# Patient Record
Sex: Female | Born: 2000 | Race: Black or African American | Hispanic: No | Marital: Single | Smoking: Never smoker
Health system: Southern US, Community
[De-identification: ages and names within clinical notes are randomized; demographics above are authoritative.]

## PROBLEM LIST (undated history)

## (undated) ENCOUNTER — Inpatient Hospital Stay (HOSPITAL_COMMUNITY): Payer: Self-pay

## (undated) ENCOUNTER — Emergency Department (HOSPITAL_COMMUNITY): Admission: EM | Payer: Commercial Managed Care - HMO | Source: Home / Self Care

## (undated) DIAGNOSIS — G43909 Migraine, unspecified, not intractable, without status migrainosus: Secondary | ICD-10-CM

## (undated) HISTORY — PX: APPENDECTOMY: SHX54

---
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
## 2022-04-15 ENCOUNTER — Emergency Department (HOSPITAL_BASED_OUTPATIENT_CLINIC_OR_DEPARTMENT_OTHER)
Admission: EM | Admit: 2022-04-15 | Discharge: 2022-04-15 | Discharge status: Against Medical Advice | Payer: Commercial Managed Care - HMO | Attending: Emergency Medicine | Admitting: Emergency Medicine

## 2022-04-15 ENCOUNTER — Encounter (HOSPITAL_BASED_OUTPATIENT_CLINIC_OR_DEPARTMENT_OTHER): Payer: Self-pay | Admitting: Emergency Medicine

## 2022-04-15 DIAGNOSIS — N939 Abnormal uterine and vaginal bleeding, unspecified: Secondary | ICD-10-CM | POA: Insufficient documentation

## 2022-04-15 DIAGNOSIS — Z5321 Procedure and treatment not carried out due to patient leaving prior to being seen by health care provider: Secondary | ICD-10-CM | POA: Diagnosis not present

## 2022-04-15 DIAGNOSIS — N898 Other specified noninflammatory disorders of vagina: Secondary | ICD-10-CM | POA: Diagnosis present

## 2022-04-15 NOTE — ED Notes (Signed)
Called pt to take to treatment room  No response from lobby 

## 2022-04-15 NOTE — ED Notes (Signed)
Called for second time, no response 

## 2022-04-15 NOTE — ED Notes (Signed)
Called for third time  No one in lobby at this time

## 2022-04-15 NOTE — ED Triage Notes (Signed)
Pt states she is having vaginal itching and odor  Pt has hx of BV  Pt states she is also having light vaginal bleeding  Pt has murina birth control

## 2022-04-18 ENCOUNTER — Encounter (HOSPITAL_BASED_OUTPATIENT_CLINIC_OR_DEPARTMENT_OTHER): Payer: Self-pay | Admitting: Emergency Medicine

## 2022-04-18 ENCOUNTER — Emergency Department (HOSPITAL_BASED_OUTPATIENT_CLINIC_OR_DEPARTMENT_OTHER)
Admission: EM | Admit: 2022-04-18 | Discharge: 2022-04-18 | Disposition: A | Discharge status: Home / Self Care | Payer: Commercial Managed Care - HMO | Attending: Emergency Medicine | Admitting: Emergency Medicine

## 2022-04-18 ENCOUNTER — Other Ambulatory Visit: Payer: Self-pay

## 2022-04-18 DIAGNOSIS — B9689 Other specified bacterial agents as the cause of diseases classified elsewhere: Secondary | ICD-10-CM

## 2022-04-18 DIAGNOSIS — N760 Acute vaginitis: Secondary | ICD-10-CM | POA: Insufficient documentation

## 2022-04-18 DIAGNOSIS — N898 Other specified noninflammatory disorders of vagina: Secondary | ICD-10-CM | POA: Diagnosis present

## 2022-04-18 LAB — WET PREP, GENITAL
Sperm: NONE SEEN
Trich, Wet Prep: NONE SEEN
WBC, Wet Prep HPF POC: <10 (ref <10)
Yeast Wet Prep HPF POC: NONE SEEN

## 2022-04-18 MED ORDER — METRONIDAZOLE 500 MG PO TABS
500.0000 mg | ORAL_TABLET | Freq: Once | ORAL | Status: AC
  Administered 2022-04-18: 500 mg via ORAL
  Filled 2022-04-18: qty 1

## 2022-04-18 MED ORDER — METRONIDAZOLE 500 MG PO TABS: 500.0000 mg | ORAL_TABLET | Freq: Two times a day (BID) | ORAL | 0 refills | Status: AC

## 2022-04-18 NOTE — ED Triage Notes (Signed)
Pt reports foul vaginal odor; hx of BV; some discharge, no pain

## 2022-04-18 NOTE — ED Provider Notes (Signed)
[facility] Provider Note   CSN: [id number] Arrival date & time: 04/18/22  2140     History  Chief Complaint  Patient presents with   Vaginal Discharge    [name] is a 21 y.o. female.  Patient here for vaginal discharge.  She thinks she has bacterial vaginosis.  She has no abdominal pain.  No fever.  Nothing makes it worse or better.  Wants to be checked for BV.  Has no concern for STDs.  The history is provided by the patient.       Home Medications Prior to Admission medications   Medication Sig Start Date End Date Taking? Authorizing Provider  metroNIDAZOLE (FLAGYL) 500 MG tablet Take 1 tablet (500 mg total) by mouth 2 (two) times daily for 7 days. 04/18/22 04/25/22 Yes Damaria Stofko, DO  clindamycin-benzoyl peroxide (BENZACLIN) gel APPLY AA BID 10/15/17   [provider]  cyclobenzaprine (FLEXERIL) 10 MG tablet Take 1 tablet (10 mg total) by mouth 2 (two) times daily as needed for muscle spasms. 11/29/21   Molpus, John, MD  cyproheptadine (PERIACTIN) 4 MG tablet Take 4 mg by mouth 2 (two) times daily.    [provider]      Allergies    Patient has no known allergies.    Review of Systems   Review of Systems  Physical Exam Updated Vital Signs BP (!) 143/64   Pulse (!) 108   Temp (!) 97.5 F (36.4 C) (Oral)   Resp 18   Ht 5\' 5"  (1.651 m)   Wt 122.9 kg   SpO2 100%   BMI 45.10 kg/m  Physical Exam Constitutional:      General: She is not in acute distress. Abdominal:     Tenderness: There is no abdominal tenderness.  Genitourinary:    Comments: Deferred per patient preference Skin:    General: Skin is warm.  Neurological:     Mental Status: She is alert.     ED Results / Procedures / Treatments   Labs (all labs ordered are listed, but only abnormal results are displayed) Labs Reviewed  WET PREP, GENITAL - Abnormal; Notable for the following components:      Result Value   Clue Cells Wet Prep  HPF POC PRESENT (*)    All other components within normal limits  GC/CHLAMYDIA PROBE AMP ([place]) NOT AT [facility]    EKG None  Radiology No results found.  Procedures Procedures    Medications Ordered in ED Medications  metroNIDAZOLE (FLAGYL) tablet 500 mg (has no administration in time range)    ED Course/ Medical Decision Making/ A&P                           Medical Decision Making Amount and/or Complexity of Data Reviewed Labs: ordered.  Risk Prescription drug management.   [name] is here for vaginal discharge.  She is concerned she has BV.  Normal vitals.  No fever.  Denies any pain.  She has had some clear discharge.  Feels like when she has had BV.  She has normal vitals.  No tenderness.  Have no concern for PID.  We will have her self swab for gonorrhea, chlamydia, BV, yeast infection.  Positive for bacterial vaginosis.  Will treat with Flagyl.  Discharged in good condition.  This chart was dictated using voice recognition software.  Despite best efforts to proofread,  errors can occur which can change the  documentation meaning.         Final Clinical Impression(s) / ED Diagnoses Final diagnoses:  BV (bacterial vaginosis)    Rx / DC Orders ED Discharge Orders          Ordered    metroNIDAZOLE (FLAGYL) 500 MG tablet  2 times daily        04/18/22 2230              Lennice Sites, DO 04/18/22 2230

## 2022-04-18 NOTE — Discharge Instructions (Addendum)
Take next dose of antibiotic for bacterial vaginosis tomorrow morning.  Take as prescribed.  Follow-up your gonorrhea and Chlamydia test in a MyChart.

## 2022-04-20 LAB — GC/CHLAMYDIA PROBE AMP (~~LOC~~) NOT AT ARMC
Chlamydia: NEGATIVE
Comment: NEGATIVE
Comment: NORMAL
Neisseria Gonorrhea: NEGATIVE

## 2022-04-25 ENCOUNTER — Encounter (HOSPITAL_BASED_OUTPATIENT_CLINIC_OR_DEPARTMENT_OTHER): Payer: Self-pay | Admitting: Emergency Medicine

## 2022-04-25 ENCOUNTER — Other Ambulatory Visit: Payer: Self-pay

## 2022-04-25 ENCOUNTER — Emergency Department (HOSPITAL_BASED_OUTPATIENT_CLINIC_OR_DEPARTMENT_OTHER)
Admission: EM | Admit: 2022-04-25 | Discharge: 2022-04-25 | Disposition: A | Discharge status: Home / Self Care | Payer: Commercial Managed Care - HMO | Attending: Emergency Medicine | Admitting: Emergency Medicine

## 2022-04-25 DIAGNOSIS — J029 Acute pharyngitis, unspecified: Secondary | ICD-10-CM | POA: Diagnosis present

## 2022-04-25 DIAGNOSIS — J020 Streptococcal pharyngitis: Secondary | ICD-10-CM | POA: Diagnosis not present

## 2022-04-25 LAB — GROUP A STREP BY PCR: Group A Strep by PCR: NOT DETECTED

## 2022-04-25 MED ORDER — PENICILLIN V POTASSIUM 500 MG PO TABS: 500.0000 mg | ORAL_TABLET | Freq: Two times a day (BID) | ORAL | 0 refills | Status: AC

## 2022-04-25 NOTE — ED Provider Notes (Signed)
[facility] Provider Note   CSN: [id number] Arrival date & time: 04/25/22  1713     History  Chief Complaint  Patient presents with   Sore Throat    [name] is a 21 y.o. female who presents emergency department with concerns for sore throat onset yesterday.  Denies sick contacts at this time.  Has associated painful swallowing.  No meds tried prior to arrival.  Denies trouble swallowing, rhinorrhea, nasal congestion, fever, cough.  The history is provided by the patient. No language interpreter was used.       Home Medications Prior to Admission medications   Medication Sig Start Date End Date Taking? Authorizing Provider  penicillin v potassium (VEETID) 500 MG tablet Take 1 tablet (500 mg total) by mouth 2 (two) times daily for 10 days. 04/25/22 05/05/22 Yes Leavy Heatherly A, PA-C  clindamycin-benzoyl peroxide (BENZACLIN) gel APPLY AA BID 10/15/17   [provider]  cyclobenzaprine (FLEXERIL) 10 MG tablet Take 1 tablet (10 mg total) by mouth 2 (two) times daily as needed for muscle spasms. 11/29/21   Molpus, John, MD  cyproheptadine (PERIACTIN) 4 MG tablet Take 4 mg by mouth 2 (two) times daily.    [provider]  metroNIDAZOLE (FLAGYL) 500 MG tablet Take 1 tablet (500 mg total) by mouth 2 (two) times daily for 7 days. 04/18/22 04/25/22  Lennice Sites, DO      Allergies    Patient has no known allergies.    Review of Systems   Review of Systems  All other systems reviewed and are negative.   Physical Exam Updated Vital Signs BP 128/88   Pulse (!) 118   Temp 98.7 F (37.1 C) (Oral)   Resp 17   Ht 5\' 5"  (1.651 m)   Wt 122.9 kg   LMP 04/04/2022   SpO2 98%   BMI 45.10 kg/m  Physical Exam Vitals and nursing note reviewed.  Constitutional:      General: She is not in acute distress.    Appearance: Normal appearance.  HENT:     Mouth/Throat:     Mouth: Mucous membranes are moist.     Pharynx: Oropharynx is  clear. Uvula midline. Posterior oropharyngeal erythema present. No uvula swelling.     Tonsils: Tonsillar exudate present.     Comments: Uvula midline without swelling.  Posterior pharyngeal erythema and tonsillar exudate noted.  Able to speak in clear complete sentences.  Eyes:     General: No scleral icterus.    Extraocular Movements: Extraocular movements intact.  Cardiovascular:     Rate and Rhythm: Normal rate.  Pulmonary:     Effort: Pulmonary effort is normal. No respiratory distress.  Abdominal:     Palpations: Abdomen is soft. There is no mass.     Tenderness: There is no abdominal tenderness.  Musculoskeletal:        General: Normal range of motion.     Cervical back: Neck supple.  Skin:    General: Skin is warm and dry.     Findings: No rash.  Neurological:     Mental Status: She is alert.     Sensory: Sensation is intact.     Motor: Motor function is intact.  Psychiatric:        Behavior: Behavior normal.     ED Results / Procedures / Treatments   Labs (all labs ordered are listed, but only abnormal results are displayed) Labs Reviewed  GROUP A STREP BY PCR  EKG None  Radiology No results found.  Procedures Procedures    Medications Ordered in ED Medications - No data to display  ED Course/ Medical Decision Making/ A&P Clinical Course as of 04/25/22 1830  Sun Apr 25, 2022  1750 Offered patient ibuprofen and Tylenol, patient declines at this time. [SB]  1750 Discussed with patient swab results.  Discussed discharge treatment plan.  Answered all of your questions.  Patient for safe discharge at this time. [SB]    Clinical Course User Index [SB] Keaunna Skipper A, PA-C                           Medical Decision Making Amount and/or Complexity of Data Reviewed Labs:  Decision-making details documented in ED Course.  Risk Prescription drug management.   Patient with sore throat onset yesterday.  Denies sick contacts.  On exam patient with Uvula  midline without swelling.  Posterior pharyngeal erythema and tonsillar exudate noted.  Able to speak in clear complete sentences.  Differential diagnosis includes strep pharyngitis, peritonsillar abscess, strep pharyngitis, or Ludwig's angina.   Labs:  I ordered, and personally interpreted labs.  The pertinent results include:   Strep swab negative   Disposition: Pt presentation suspicious for strep pharyngitis. Doubt COVID or flu at this time. Patent airway, tolerating secretions, no concern for airway compromise. Less likely Ludwigs angina, no trismus or edema to floor of mouth on exam. Less likely peritonsillar abscess, no fluctuant abscess noted on exam, patent airway, oxygen saturation 100%, water and tolerating secretions.  Patient sent with a prescription for penicillin.  After consideration of the diagnostic results and the patients response to treatment, I feel that the patient would benefit from Discharge home.  Supportive care measures and strict return cautions discussed with patient at bedside.  Patient acknowledges and verbalizes understanding.  Recommended primary care follow-up.  Patient appears safe for discharge at this time.  Follow-up as indicated in discharge paperwork.  This chart was dictated using voice recognition software, Dragon. Despite the best efforts of this provider to proofread and correct errors, errors may still occur which can change documentation meaning.   Final Clinical Impression(s) / ED Diagnoses Final diagnoses:  Strep pharyngitis    Rx / DC Orders ED Discharge Orders          Ordered    penicillin v potassium (VEETID) 500 MG tablet  2 times daily        04/25/22 1808              Queenie Aufiero A, PA-C 04/25/22 1830    Glyn Ade, MD 04/26/22 1506

## 2022-04-25 NOTE — ED Triage Notes (Signed)
Pt arrives pov, steady gait, c/o sore throat since yesterday

## 2022-04-25 NOTE — Discharge Instructions (Signed)
It was a pleasure taking care of you today!  Your strep test was negative however due to the concerns on the physical exam today, you will be sent with a prescription for penicillin to treat for strep pharyngitis. You may take over-the-counter 600 mg ibuprofen every 6 hours and alternate with 500 mg Tylenol every 6 hours as needed for pain for no more than 7 days.  Ensure to maintain fluid intake with tea, broth, soup, Gatorade, Pedialyte, water.  You may follow-up with your primary care provider as needed.  Return to the emergency department if experiencing increasing/worsening trouble swallowing, trouble breathing, fever, worsening symptoms.

## 2022-08-22 ENCOUNTER — Encounter (HOSPITAL_BASED_OUTPATIENT_CLINIC_OR_DEPARTMENT_OTHER): Payer: Self-pay | Admitting: Emergency Medicine

## 2022-08-22 ENCOUNTER — Emergency Department (HOSPITAL_BASED_OUTPATIENT_CLINIC_OR_DEPARTMENT_OTHER)
Admission: EM | Admit: 2022-08-22 | Discharge: 2022-08-22 | Disposition: A | Discharge status: Home / Self Care | Payer: Commercial Managed Care - HMO | Attending: Emergency Medicine | Admitting: Emergency Medicine

## 2022-08-22 ENCOUNTER — Emergency Department (HOSPITAL_BASED_OUTPATIENT_CLINIC_OR_DEPARTMENT_OTHER): Payer: Commercial Managed Care - HMO

## 2022-08-22 ENCOUNTER — Other Ambulatory Visit: Payer: Self-pay

## 2022-08-22 DIAGNOSIS — M7989 Other specified soft tissue disorders: Secondary | ICD-10-CM

## 2022-08-22 DIAGNOSIS — R2231 Localized swelling, mass and lump, right upper limb: Secondary | ICD-10-CM | POA: Insufficient documentation

## 2022-08-22 MED ORDER — IBUPROFEN 400 MG PO TABS: 400.0000 mg | ORAL_TABLET | Freq: Four times a day (QID) | ORAL | 0 refills | Status: DC | PRN

## 2022-08-22 MED ORDER — IBUPROFEN 400 MG PO TABS
400.0000 mg | ORAL_TABLET | Freq: Once | ORAL | Status: AC
  Administered 2022-08-22: 400 mg via ORAL
  Filled 2022-08-22: qty 1

## 2022-08-22 NOTE — ED Provider Notes (Signed)
[facility] Provider Note   CSN: [id number] Arrival date & time: 08/22/22  0204     History  Chief Complaint  Patient presents with   Hand Swelling    [name] is a 22 y.o. female.  The history is provided by the patient.  Illness Location:  Right hand Quality:  Swelling Severity:  Moderate Onset quality:  Gradual Duration: months. Timing:  Constant Progression:  Unchanged Chronicity:  Chronic Context:  No trauma.  No change in diet Relieved by:  Nothing Worsened by:  Nothing Ineffective treatments:  None Associated symptoms: no fever and no wheezing   Also feels like left hand may be swelling.       Home Medications Prior to Admission medications   Medication Sig Start Date End Date Taking? Authorizing Provider  ibuprofen (ADVIL) 400 MG tablet Take 1 tablet (400 mg total) by mouth every 6 (six) hours as needed. 08/22/22  Yes Lavoris Sparling, MD  clindamycin-benzoyl peroxide (BENZACLIN) gel APPLY AA BID 10/15/17   [provider]  cyclobenzaprine (FLEXERIL) 10 MG tablet Take 1 tablet (10 mg total) by mouth 2 (two) times daily as needed for muscle spasms. 11/29/21   Molpus, John, MD  cyproheptadine (PERIACTIN) 4 MG tablet Take 4 mg by mouth 2 (two) times daily.    [provider]      Allergies    Patient has no known allergies.    Review of Systems   Review of Systems  Constitutional:  Negative for fever.  HENT:  Negative for facial swelling.   Respiratory:  Negative for wheezing and stridor.   All other systems reviewed and are negative.   Physical Exam Updated Vital Signs BP 128/88 (BP Location: Left Arm)   Pulse 89   Temp 98.7 F (37.1 C) (Oral)   Resp 18   Ht '5\' 5"'$  (1.651 m)   Wt 125 kg   SpO2 100%   BMI 45.86 kg/m  Physical Exam Vitals and nursing note reviewed.  Constitutional:      General: She is not in acute distress.    Appearance: Normal appearance. She is well-developed.   HENT:     Head: Normocephalic and atraumatic.     Nose: Nose normal.  Eyes:     Pupils: Pupils are equal, round, and reactive to light.  Cardiovascular:     Rate and Rhythm: Normal rate and regular rhythm.     Pulses: Normal pulses.     Heart sounds: Normal heart sounds.  Pulmonary:     Effort: Pulmonary effort is normal. No respiratory distress.     Breath sounds: Normal breath sounds.  Abdominal:     General: Bowel sounds are normal. There is no distension.     Palpations: Abdomen is soft.     Tenderness: There is no abdominal tenderness. There is no guarding or rebound.  Genitourinary:    Vagina: No vaginal discharge.  Musculoskeletal:        General: Normal range of motion.     Right wrist: No swelling, deformity, effusion, bony tenderness, snuff box tenderness or crepitus. Normal range of motion. Normal pulse.     Left wrist: No swelling, deformity, bony tenderness, snuff box tenderness or crepitus. Normal range of motion. Normal pulse.     Right hand: No deformity, lacerations or bony tenderness. Normal range of motion. Normal strength. Normal sensation. There is no disruption of two-point discrimination. Normal capillary refill. Normal pulse.     Cervical  back: Neck supple.     Comments: No edema of the hands, no warmth no erythema, Skin is intact and normal cap refill < 2 sec  Skin:    General: Skin is warm and dry.     Capillary Refill: Capillary refill takes less than 2 seconds.     Coloration: Skin is not pale.     Findings: No bruising, erythema or rash.  Neurological:     General: No focal deficit present.     Mental Status: She is alert.     Deep Tendon Reflexes: Reflexes normal.  Psychiatric:        Mood and Affect: Mood normal.     ED Results / Procedures / Treatments   Labs (all labs ordered are listed, but only abnormal results are displayed) Labs Reviewed - No data to display  EKG None  Radiology DG Hand Complete Right  Result Date:  08/22/2022 CLINICAL DATA:  Hand pain for 1 month, no known injury, initial encounter EXAM: RIGHT HAND - COMPLETE 3+ VIEW COMPARISON:  None Available. FINDINGS: There is no evidence of fracture or dislocation. There is no evidence of arthropathy or other focal bone abnormality. Soft tissues are unremarkable. IMPRESSION: No acute abnormality noted. Electronically Signed   By: Inez Catalina M.D.   On: 08/22/2022 02:26    Procedures Procedures    Medications Ordered in ED Medications  ibuprofen (ADVIL) tablet 400 mg (has no administration in time range)    ED Course/ Medical Decision Making/ A&P                             Medical Decision Making Hand swelling R for 2 months now feels left is also swelling No trauma   Amount and/or Complexity of Data Reviewed External Data Reviewed: notes.    Details: Previous notes reviewed  Radiology: ordered and independent interpretation performed.    Details: Normal hand Xray   Risk Prescription drug management. Risk Details: No signs of trauma.  No edema.  Exam is benign and reassuring. Ice elevation and NSAIDs and follow up with a family doctor for ongoing care.      Final Clinical Impression(s) / ED Diagnoses Final diagnoses:  None   Return for intractable cough, coughing up blood, fevers > 100.4 unrelieved by medication, shortness of breath, intractable vomiting, chest pain, shortness of breath, weakness, numbness, changes in speech, facial asymmetry, abdominal pain, passing out, Inability to tolerate liquids or food, cough, altered mental status or any concerns. No signs of systemic illness or infection. The patient is nontoxic-appearing on exam and vital signs are within normal limits.  I have reviewed the triage vital signs and the nursing notes. Pertinent labs & imaging results that were available during my care of the patient were reviewed by me and considered in my medical decision making (see chart for details). After history, exam, and  medical workup I feel the patient has been appropriately medically screened and is safe for discharge home. Pertinent diagnoses were discussed with the patient. Patient was given return precautions.  Rx / DC Orders ED Discharge Orders          Ordered    ibuprofen (ADVIL) 400 MG tablet  Every 6 hours PRN        08/22/22 0417              Dailin Sosnowski, MD 08/22/22 [id number]

## 2022-08-22 NOTE — ED Triage Notes (Signed)
Patient reports left finger and hand swelling x 3 weeks.  Patient reports her right hand is starting to do the same.  Patient has obvious swelling to left hand.  No redness or warmth noted, patient reports no fevers.

## 2022-09-26 ENCOUNTER — Encounter (HOSPITAL_BASED_OUTPATIENT_CLINIC_OR_DEPARTMENT_OTHER): Payer: Self-pay

## 2022-09-26 ENCOUNTER — Emergency Department (HOSPITAL_BASED_OUTPATIENT_CLINIC_OR_DEPARTMENT_OTHER)
Admission: EM | Admit: 2022-09-26 | Discharge: 2022-09-26 | Disposition: A | Discharge status: Home / Self Care | Payer: Commercial Managed Care - HMO | Attending: Emergency Medicine | Admitting: Emergency Medicine

## 2022-09-26 DIAGNOSIS — R110 Nausea: Secondary | ICD-10-CM | POA: Diagnosis present

## 2022-09-26 LAB — URINALYSIS, ROUTINE W REFLEX MICROSCOPIC
Bilirubin Urine: NEGATIVE
Glucose, UA: NEGATIVE mg/dL
Hgb urine dipstick: NEGATIVE
Ketones, ur: NEGATIVE mg/dL
Leukocytes,Ua: NEGATIVE
Nitrite: NEGATIVE
Protein, ur: NEGATIVE mg/dL
Specific Gravity, Urine: 1.014 (ref 1.005–1.030)
pH: 8 (ref 5.0–8.0)

## 2022-09-26 LAB — PREGNANCY, URINE: Preg Test, Ur: NEGATIVE

## 2022-09-26 NOTE — ED Triage Notes (Signed)
Pt c/o morning sickness x3 mornings, "smells of food," unsure if pregnant, states her LMP was 2wks ago, "8hrs of bleeding one day last week." Endorses intermittent abd cramping, poor PO/ "no appetite, haven't had anything to eat all day." States she smokes marijuana every day.

## 2022-09-26 NOTE — ED Provider Notes (Signed)
[facility] AT [facility] Provider Note   CSN: [id number] Arrival date & time: 09/26/22  1621     History  Chief Complaint  Patient presents with   Nausea    [name] is a 22 y.o. female, no pertinent past medical history, who presents to the ED secondary to nausea for the last 3 mornings.  She states she does have reduced appetite, and feels sick in the mornings.  Denies any vomiting.  Is concerned that she may be pregnant.  Last menstrual period was 2 weeks ago and she states she only bled heavily for 1 day, and her periods typically last for 7 days.  She is sexually active and has an IUD.  Is mostly concerned she is pregnant.  Denies any abdominal pain or new vaginal discharge.    Does smoke marijuana daily. Home Medications Prior to Admission medications   Medication Sig Start Date End Date Taking? Authorizing Provider  clindamycin-benzoyl peroxide (BENZACLIN) gel APPLY AA BID 10/15/17   [provider]  cyclobenzaprine (FLEXERIL) 10 MG tablet Take 1 tablet (10 mg total) by mouth 2 (two) times daily as needed for muscle spasms. 11/29/21   Molpus, John, MD  cyproheptadine (PERIACTIN) 4 MG tablet Take 4 mg by mouth 2 (two) times daily.    [provider]  ibuprofen (ADVIL) 400 MG tablet Take 1 tablet (400 mg total) by mouth every 6 (six) hours as needed. 08/22/22   Palumbo, April, MD      Allergies    Patient has no known allergies.    Review of Systems   Review of Systems  Gastrointestinal:  Positive for nausea. Negative for abdominal pain.  Genitourinary:  Positive for vaginal bleeding.    Physical Exam Updated Vital Signs BP (!) 147/95   Pulse (!) 117   Temp 99.5 F (37.5 C) (Oral)   Resp 16   SpO2 99%  Physical Exam Vitals and nursing note reviewed.  Constitutional:      General: She is not in acute distress.    Appearance: She is well-developed.  HENT:     Head: Normocephalic and atraumatic.  Eyes:      Conjunctiva/sclera: Conjunctivae normal.  Cardiovascular:     Rate and Rhythm: Normal rate and regular rhythm.     Heart sounds: No murmur heard. Pulmonary:     Effort: Pulmonary effort is normal. No respiratory distress.     Breath sounds: Normal breath sounds.  Abdominal:     Palpations: Abdomen is soft.     Tenderness: There is no abdominal tenderness.  Musculoskeletal:        General: No swelling.     Cervical back: Neck supple.  Skin:    General: Skin is warm and dry.     Capillary Refill: Capillary refill takes less than 2 seconds.  Neurological:     Mental Status: She is alert.  Psychiatric:        Mood and Affect: Mood normal.     ED Results / Procedures / Treatments   Labs (all labs ordered are listed, but only abnormal results are displayed) Labs Reviewed  URINALYSIS, ROUTINE W REFLEX MICROSCOPIC  PREGNANCY, URINE    EKG None  Radiology No results found.  Procedures Procedures    Medications Ordered in ED Medications - No data to display  ED Course/ Medical Decision Making/ A&P  Medical Decision Making Patient is a 22 year old female, here for nausea for the last 3 days, in the morning.  Worse with food.  Is concerned she may be pregnant requesting pregnancy test.  Overall abdomen soft, we will obtain urine pregnancy.  Overall well-appearing declines further workup.  Amount and/or Complexity of Data Reviewed Labs: ordered.    Details: UPT negative Discussion of management or test interpretation with external provider(s): Discussed with patient, urine pregnancy test negative.  No evidence of UTI.  She will need to follow-up with her primary care doctor, I am suspicious that her nausea may be secondary to her marijuana use.  We discussed following up with primary care and cessation of marijuana.  Return precautions emphasized.    Final Clinical Impression(s) / ED Diagnoses Final diagnoses:  Nausea    Rx / DC Orders ED  Discharge Orders     None         Eleaner Dibartolo, Harley [place], PA 09/26/22 1706    Cathren Laine, MD 09/26/22 1819

## 2022-09-26 NOTE — Discharge Instructions (Addendum)
Today you are not pregnant, your nausea may be secondary to your marijuana usage.  Stop smoking marijuana as this can cause nausea and vomiting.  Make sure you are eating bland foods to help with your nausea.  Your urinalysis was negative for any kind of infection.  If you have severe abdominal pain, intractable nausea or vomiting please return to the ER.

## 2022-11-02 ENCOUNTER — Ambulatory Visit: Payer: Commercial Managed Care - HMO

## 2022-11-03 ENCOUNTER — Ambulatory Visit
Admission: EM | Admit: 2022-11-03 | Discharge: 2022-11-03 | Disposition: A | Discharge status: Home / Self Care | Payer: Medicaid Other | Attending: Urgent Care | Admitting: Urgent Care

## 2022-11-03 DIAGNOSIS — B9689 Other specified bacterial agents as the cause of diseases classified elsewhere: Secondary | ICD-10-CM | POA: Diagnosis present

## 2022-11-03 DIAGNOSIS — N760 Acute vaginitis: Secondary | ICD-10-CM | POA: Insufficient documentation

## 2022-11-03 LAB — POCT URINE PREGNANCY: Preg Test, Ur: NEGATIVE

## 2022-11-03 MED ORDER — METRONIDAZOLE 500 MG PO TABS: 500.0000 mg | ORAL_TABLET | Freq: Two times a day (BID) | ORAL | 0 refills | Status: DC

## 2022-11-03 NOTE — ED Triage Notes (Signed)
Pt reports she had vaginal discharge and she took some medication she had for BV. Yesterday was the last day she had vaginal discharge

## 2022-11-03 NOTE — ED Provider Notes (Signed)
[facility]  Note:  This document was prepared using Conservation officer, historic buildings and may include unintentional dictation errors.  MRN: [id number] DOB: [birth year]  Subjective:   [name] is a 22 y.o. female presenting for persistent vaginal discharge.  Patient was diagnosed with BV and treated with metronidazole.  She states that she was inconsistent with the medication but restarted it in the past 2 days, her vaginal discharge ended up resolving yesterday.  However, she wanted to make sure she got a pregnancy test and retested.  No current facility-administered medications for this encounter.  Current Outpatient Medications:    clindamycin-benzoyl peroxide (BENZACLIN) gel, APPLY AA BID, Disp: , Rfl: 11   cyclobenzaprine (FLEXERIL) 10 MG tablet, Take 1 tablet (10 mg total) by mouth 2 (two) times daily as needed for muscle spasms., Disp: 20 tablet, Rfl: 0   cyproheptadine (PERIACTIN) 4 MG tablet, Take 4 mg by mouth 2 (two) times daily., Disp: , Rfl:    ibuprofen (ADVIL) 400 MG tablet, Take 1 tablet (400 mg total) by mouth every 6 (six) hours as needed., Disp: 12 tablet, Rfl: 0   No Known Allergies  History reviewed. No pertinent past medical history.   Past Surgical History:  Procedure Laterality Date   APPENDECTOMY     LAPAROSCOPIC APPENDECTOMY N/A 02/04/2014   Procedure: APPENDECTOMY LAPAROSCOPIC;  Surgeon: Judie Petit. Leonia Corona, MD;  Location: [facility] OR;  Service: Pediatrics;  Laterality: N/A;    Family History  Problem Relation Age of Onset   Heart disease Father    Heart disease Maternal Grandmother    Hypertension Maternal Grandmother     Social History   Tobacco Use   Smoking status: Never    Passive exposure: Yes   Smokeless tobacco: Never  Vaping Use   Vaping Use: Never used  Substance Use Topics   Alcohol use: No   Drug use: Yes    Types: Marijuana    ROS   Objective:   Vitals: BP (!) 143/89 (BP Location: Right Arm)   Pulse  67   Temp 99.2 F (37.3 C) (Oral)   Resp 18   LMP  (Within Months) Comment: 1 month  SpO2 98%   Physical Exam Constitutional:      General: She is not in acute distress.    Appearance: Normal appearance. She is well-developed. She is not ill-appearing, toxic-appearing or diaphoretic.  HENT:     Head: Normocephalic and atraumatic.     Nose: Nose normal.     Mouth/Throat:     Mouth: Mucous membranes are moist.  Eyes:     General: No scleral icterus.       Right eye: No discharge.        Left eye: No discharge.     Extraocular Movements: Extraocular movements intact.  Cardiovascular:     Rate and Rhythm: Normal rate.  Pulmonary:     Effort: Pulmonary effort is normal.  Skin:    General: Skin is warm and dry.  Neurological:     General: No focal deficit present.     Mental Status: She is alert and oriented to person, place, and time.  Psychiatric:        Mood and Affect: Mood normal.        Behavior: Behavior normal.    Results for orders placed or performed during the hospital encounter of 11/03/22 (from the past 24 hour(s))  POCT urine pregnancy     Status: None   Collection Time:  11/03/22  5:32 PM  Result Value Ref Range   Preg Test, Ur Negative Negative   Assessment and Plan :   PDMP not reviewed this encounter.  1. Bacterial vaginosis    Patient would like to finish out a 7-day course of metronidazole for recurrent BV.  I was agreeable to do this.  Does not want any other empiric treatment.  STI testing pending.  Counseled patient on potential for adverse effects with medications prescribed/recommended today, ER and return-to-clinic precautions discussed, patient verbalized understanding.    Wallis Bamberg, PA-C 11/03/22 1750

## 2022-11-04 LAB — CERVICOVAGINAL ANCILLARY ONLY
Bacterial Vaginitis (gardnerella): NEGATIVE
Candida Glabrata: NEGATIVE
Candida Vaginitis: NEGATIVE
Chlamydia: NEGATIVE
Comment: NEGATIVE
Comment: NEGATIVE
Comment: NEGATIVE
Comment: NEGATIVE
Comment: NEGATIVE
Comment: NORMAL
Neisseria Gonorrhea: NEGATIVE
Trichomonas: NEGATIVE

## 2022-11-22 ENCOUNTER — Ambulatory Visit
Admission: EM | Admit: 2022-11-22 | Discharge: 2022-11-22 | Disposition: A | Discharge status: Home / Self Care | Payer: Medicaid Other | Attending: Internal Medicine | Admitting: Internal Medicine

## 2022-11-22 DIAGNOSIS — R350 Frequency of micturition: Secondary | ICD-10-CM

## 2022-11-22 DIAGNOSIS — R300 Dysuria: Secondary | ICD-10-CM | POA: Diagnosis not present

## 2022-11-22 LAB — POCT URINALYSIS DIP (MANUAL ENTRY)
Bilirubin, UA: NEGATIVE
Glucose, UA: NEGATIVE mg/dL
Ketones, POC UA: NEGATIVE mg/dL
Nitrite, UA: NEGATIVE
Protein Ur, POC: NEGATIVE mg/dL
Spec Grav, UA: 1.02 (ref 1.010–1.025)
Urobilinogen, UA: 0.2 E.U./dL
pH, UA: 8.5 — AB (ref 5.0–8.0)

## 2022-11-22 LAB — POCT URINE PREGNANCY: Preg Test, Ur: NEGATIVE

## 2022-11-22 NOTE — Discharge Instructions (Signed)
Make sure you hydrate very well with plain water and a quantity of 80 ounces of water a day.  Please limit drinks that are considered urinary irritants such as soda, sweet tea, coffee, energy drinks, alcohol.  These can worsen your urinary and genital symptoms but also be the source of them.  I will let you know about your urine culture and vaginal cytology results through MyChart to see if we need to prescribe or change your antibiotics based off of those results.

## 2022-11-22 NOTE — ED Provider Notes (Signed)
[facility]  Note:  This document was prepared using Conservation officer, historic buildings and may include unintentional dictation errors.  MRN: [id number] DOB: Jun 21, 2001  Subjective:   Grainne Schlarb is a 22 y.o. female presenting for 1 week history of urinary frequency, dysuria. Feels slight vaginal swelling. Denies fever, n/v, abdominal pain, pelvic pain, rashes, hematuria, vaginal discharge.  No worries about an STI but would like to be tested. Has 1 sex partner, no protection, has 1 female partner. Has 1 bottle of water daily. Has Gatorade, Minute Maid juice.   No current facility-administered medications for this encounter.  Current Outpatient Medications:    clindamycin-benzoyl peroxide (BENZACLIN) gel, APPLY AA BID, Disp: , Rfl: 11   cyclobenzaprine (FLEXERIL) 10 MG tablet, Take 1 tablet (10 mg total) by mouth 2 (two) times daily as needed for muscle spasms., Disp: 20 tablet, Rfl: 0   cyproheptadine (PERIACTIN) 4 MG tablet, Take 4 mg by mouth 2 (two) times daily., Disp: , Rfl:    ibuprofen (ADVIL) 400 MG tablet, Take 1 tablet (400 mg total) by mouth every 6 (six) hours as needed., Disp: 12 tablet, Rfl: 0   metroNIDAZOLE (FLAGYL) 500 MG tablet, Take 1 tablet (500 mg total) by mouth 2 (two) times daily., Disp: 14 tablet, Rfl: 0   No Known Allergies  History reviewed. No pertinent past medical history.   Past Surgical History:  Procedure Laterality Date   APPENDECTOMY     LAPAROSCOPIC APPENDECTOMY N/A 02/04/2014   Procedure: APPENDECTOMY LAPAROSCOPIC;  Surgeon: Judie Petit. Leonia Corona, MD;  Location: [facility] OR;  Service: Pediatrics;  Laterality: N/A;    Family History  Problem Relation Age of Onset   Heart disease Father    Heart disease Maternal Grandmother    Hypertension Maternal Grandmother     Social History   Tobacco Use   Smoking status: Never    Passive exposure: Yes   Smokeless tobacco: Never  Vaping Use   Vaping Use: Never used  Substance Use  Topics   Alcohol use: No   Drug use: Yes    Types: Marijuana    ROS   Objective:   Vitals: BP (!) 157/104 (BP Location: Right Arm)   Pulse 77   Temp 98.3 F (36.8 C) (Oral)   Resp 20   LMP 11/06/2022   SpO2 98%   BP Readings from Last 3 Encounters:  11/22/22 (!) 157/104  11/03/22 (!) 143/89  09/26/22 (!) 147/95   Physical Exam Constitutional:      General: She is not in acute distress.    Appearance: Normal appearance. She is well-developed. She is not ill-appearing, toxic-appearing or diaphoretic.  HENT:     Head: Normocephalic and atraumatic.     Nose: Nose normal.     Mouth/Throat:     Mouth: Mucous membranes are moist.  Eyes:     General: No scleral icterus.       Right eye: No discharge.        Left eye: No discharge.     Extraocular Movements: Extraocular movements intact.  Cardiovascular:     Rate and Rhythm: Normal rate.  Pulmonary:     Effort: Pulmonary effort is normal.  Skin:    General: Skin is warm and dry.  Neurological:     General: No focal deficit present.     Mental Status: She is alert and oriented to person, place, and time.  Psychiatric:        Mood and Affect: Mood normal.  Behavior: Behavior normal.     Results for orders placed or performed during the hospital encounter of 11/22/22 (from the past 24 hour(s))  POCT urinalysis dipstick     Status: Abnormal   Collection Time: 11/22/22  5:41 PM  Result Value Ref Range   Color, UA yellow yellow   Clarity, UA clear clear   Glucose, UA negative negative mg/dL   Bilirubin, UA negative negative   Ketones, POC UA negative negative mg/dL   Spec Grav, UA 1.610 9.604 - 1.025   Blood, UA trace-intact (A) negative   pH, UA 8.5 (A) 5.0 - 8.0   Protein Ur, POC negative negative mg/dL   Urobilinogen, UA 0.2 0.2 or 1.0 E.U./dL   Nitrite, UA Negative Negative   Leukocytes, UA Trace (A) Negative  POCT urine pregnancy     Status: None   Collection Time: 11/22/22  5:42 PM  Result Value  Ref Range   Preg Test, Ur Negative Negative    Assessment and Plan :   PDMP not reviewed this encounter.  1. Urinary frequency   2. Dysuria    Patient states that she initially had vaginal itching and dysuria but this has resolved and now she only has urinary frequency.  Emphasized need to hydrate more consistently with water and avoid urinary irritants.  Will treat based off of any results that come back positive. Counseled patient on potential for adverse effects with medications prescribed/recommended today, ER and return-to-clinic precautions discussed, patient verbalized understanding.    Wallis Bamberg, [place] 11/22/22 1827

## 2022-11-22 NOTE — ED Triage Notes (Signed)
Pt c/o urinary freq and dysuria x 1 week-NAD-steady gait

## 2022-11-23 ENCOUNTER — Telehealth: Payer: Self-pay | Admitting: Emergency Medicine

## 2022-11-23 MED ORDER — SULFAMETHOXAZOLE-TRIMETHOPRIM 800-160 MG PO TABS: 1.0000 | ORAL_TABLET | Freq: Two times a day (BID) | ORAL | 0 refills | Status: AC

## 2022-11-23 MED ORDER — FLUCONAZOLE 150 MG PO TABS: 150.0000 mg | ORAL_TABLET | Freq: Once | ORAL | 0 refills | Status: AC

## 2022-11-23 NOTE — Telephone Encounter (Signed)
Patient called requesting an antibiotic for her urinalysis POC results.  Reviewed and explained provider did not see signs of infection.  Patient is concerned for UTI still and was hoping for an antibiotic.  Reviewed with Urban Gibson, will treat with Bactrim and diflucan and have patient f/u if no improvement.
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
## 2023-01-07 ENCOUNTER — Emergency Department (HOSPITAL_COMMUNITY)
Admission: EM | Admit: 2023-01-07 | Discharge: 2023-01-08 | Disposition: A | Discharge status: Home / Self Care | Payer: No Typology Code available for payment source | Attending: Emergency Medicine | Admitting: Emergency Medicine

## 2023-01-07 DIAGNOSIS — Y93G3 Activity, cooking and baking: Secondary | ICD-10-CM | POA: Diagnosis not present

## 2023-01-07 DIAGNOSIS — T22292A Burn of second degree of multiple sites of left shoulder and upper limb, except wrist and hand, initial encounter: Secondary | ICD-10-CM

## 2023-01-07 DIAGNOSIS — T25221A Burn of second degree of right foot, initial encounter: Secondary | ICD-10-CM | POA: Diagnosis not present

## 2023-01-07 DIAGNOSIS — D72829 Elevated white blood cell count, unspecified: Secondary | ICD-10-CM | POA: Diagnosis not present

## 2023-01-07 DIAGNOSIS — T22211A Burn of second degree of right forearm, initial encounter: Secondary | ICD-10-CM | POA: Diagnosis not present

## 2023-01-07 DIAGNOSIS — X102XXA Contact with fats and cooking oils, initial encounter: Secondary | ICD-10-CM | POA: Insufficient documentation

## 2023-01-07 DIAGNOSIS — T310 Burns involving less than 10% of body surface: Secondary | ICD-10-CM | POA: Diagnosis not present

## 2023-01-07 DIAGNOSIS — T22232A Burn of second degree of left upper arm, initial encounter: Secondary | ICD-10-CM | POA: Diagnosis present

## 2023-01-07 DIAGNOSIS — T2121XA Burn of second degree of chest wall, initial encounter: Secondary | ICD-10-CM | POA: Insufficient documentation

## 2023-01-07 DIAGNOSIS — T24632A Corrosion of second degree of left lower leg, initial encounter: Secondary | ICD-10-CM

## 2023-01-07 DIAGNOSIS — T24232A Burn of second degree of left lower leg, initial encounter: Secondary | ICD-10-CM | POA: Diagnosis not present

## 2023-01-08 ENCOUNTER — Other Ambulatory Visit: Payer: Self-pay

## 2023-01-08 ENCOUNTER — Encounter (HOSPITAL_COMMUNITY): Payer: Self-pay

## 2023-01-08 DIAGNOSIS — T22232A Burn of second degree of left upper arm, initial encounter: Secondary | ICD-10-CM | POA: Diagnosis not present

## 2023-01-08 LAB — CBC WITH DIFFERENTIAL/PLATELET
Abs Immature Granulocytes: 0.02 10*3/uL (ref 0.00–0.07)
Basophils Absolute: 0 10*3/uL (ref 0.0–0.1)
Basophils Relative: 0 %
Eosinophils Absolute: 0 10*3/uL (ref 0.0–0.5)
Eosinophils Relative: 0 %
HCT: 39.8 % (ref 36.0–46.0)
Hemoglobin: 13.2 g/dL (ref 12.0–15.0)
Immature Granulocytes: 0 %
Lymphocytes Relative: 23 %
Lymphs Abs: 2.6 10*3/uL (ref 0.7–4.0)
MCH: 27.2 pg (ref 26.0–34.0)
MCHC: 33.2 g/dL (ref 30.0–36.0)
MCV: 82.1 fL (ref 80.0–100.0)
Monocytes Absolute: 0.8 10*3/uL (ref 0.1–1.0)
Monocytes Relative: 7 %
Neutro Abs: 8 10*3/uL — ABNORMAL HIGH (ref 1.7–7.7)
Neutrophils Relative %: 70 %
Platelets: 221 10*3/uL (ref 150–400)
RBC: 4.85 MIL/uL (ref 3.87–5.11)
RDW: 13 % (ref 11.5–15.5)
WBC: 11.4 10*3/uL — ABNORMAL HIGH (ref 4.0–10.5)
nRBC: 0 % (ref 0.0–0.2)

## 2023-01-08 LAB — BASIC METABOLIC PANEL
Anion gap: 14 (ref 5–15)
BUN: 7 mg/dL (ref 6–20)
CO2: 19 mmol/L — ABNORMAL LOW (ref 22–32)
Calcium: 8.8 mg/dL — ABNORMAL LOW (ref 8.9–10.3)
Chloride: 105 mmol/L (ref 98–111)
Creatinine, Ser: 0.97 mg/dL (ref 0.44–1.00)
GFR, Estimated: >60 mL/min (ref >60)
Glucose, Bld: 212 mg/dL — ABNORMAL HIGH (ref 70–99)
Potassium: 3.5 mmol/L (ref 3.5–5.1)
Sodium: 138 mmol/L (ref 135–145)

## 2023-01-08 LAB — HCG, SERUM, QUALITATIVE: Preg, Serum: NEGATIVE

## 2023-01-08 MED ORDER — HYDROMORPHONE HCL 1 MG/ML IJ SOLN
1.0000 mg | Freq: Once | INTRAMUSCULAR | Status: AC
  Administered 2023-01-08: 1 mg via INTRAVENOUS
  Filled 2023-01-08: qty 1

## 2023-01-08 MED ORDER — BACITRACIN ZINC 500 UNIT/GM EX OINT
TOPICAL_OINTMENT | Freq: Two times a day (BID) | CUTANEOUS | Status: DC
  Filled 2023-01-08 (×2): qty 28.4

## 2023-01-08 MED ORDER — OXYCODONE-ACETAMINOPHEN 5-325 MG PO TABS
1.0000 | ORAL_TABLET | ORAL | Status: AC | PRN
  Administered 2023-01-08: 1 via ORAL
  Filled 2023-01-08: qty 1

## 2023-01-08 MED ORDER — LIDOCAINE HCL URETHRAL/MUCOSAL 2 % EX GEL
1.0000 | Freq: Once | CUTANEOUS | Status: AC
  Administered 2023-01-08: 1 via TOPICAL
  Filled 2023-01-08 (×6): qty 6

## 2023-01-08 MED ORDER — OXYCODONE-ACETAMINOPHEN 5-325 MG PO TABS: 1.0000 | ORAL_TABLET | Freq: Four times a day (QID) | ORAL | 0 refills | Status: DC | PRN

## 2023-01-08 MED ORDER — KETOROLAC TROMETHAMINE 15 MG/ML IJ SOLN
15.0000 mg | Freq: Once | INTRAMUSCULAR | Status: AC
  Administered 2023-01-08: 15 mg via INTRAVENOUS
  Filled 2023-01-08: qty 1

## 2023-01-08 MED ORDER — BACITRACIN ZINC 500 UNIT/GM EX OINT: 1.0000 | TOPICAL_OINTMENT | Freq: Two times a day (BID) | CUTANEOUS | 0 refills | Status: DC

## 2023-01-08 MED ORDER — ONDANSETRON HCL 4 MG/2ML IJ SOLN
INTRAMUSCULAR | Status: AC
  Administered 2023-01-08: 4 mg via INTRAVENOUS
  Filled 2023-01-08: qty 2

## 2023-01-08 MED ORDER — ONDANSETRON HCL 4 MG/2ML IJ SOLN: 4.0000 mg | Freq: Once | INTRAMUSCULAR | Status: AC

## 2023-01-08 MED ORDER — CEPHALEXIN 500 MG PO CAPS: 500.0000 mg | ORAL_CAPSULE | Freq: Four times a day (QID) | ORAL | 0 refills | Status: AC

## 2023-01-08 MED ORDER — LACTATED RINGERS IV BOLUS
1000.0000 mL | Freq: Once | INTRAVENOUS | Status: AC
  Administered 2023-01-08: 1000 mL via INTRAVENOUS
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
## 2023-01-08 NOTE — ED Notes (Signed)
Patient remains in considerable pain despite multiple does of pain medications. Family supportive at bedside

## 2023-03-26 ENCOUNTER — Ambulatory Visit
Admission: RE | Admit: 2023-03-26 | Discharge: 2023-03-26 | Disposition: A | Discharge status: Home / Self Care | Payer: No Typology Code available for payment source | Source: Ambulatory Visit | Attending: Internal Medicine

## 2023-03-26 VITALS — BP 135/85 | HR 66 | Temp 97.6°F | Resp 16

## 2023-03-26 DIAGNOSIS — G43819 Other migraine, intractable, without status migrainosus: Secondary | ICD-10-CM

## 2023-03-26 MED ORDER — SUMATRIPTAN SUCCINATE 100 MG PO TABS
100.0000 mg | ORAL_TABLET | ORAL | 0 refills | Status: DC | PRN
Start: 2023-03-26 — End: 2023-05-16

## 2023-03-26 MED ORDER — KETOROLAC TROMETHAMINE 30 MG/ML IJ SOLN
30.0000 mg | Freq: Once | INTRAMUSCULAR | Status: AC
  Administered 2023-03-26: 30 mg via INTRAMUSCULAR

## 2023-03-26 NOTE — Discharge Instructions (Signed)
You were given a Toradol injection in clinic today. Do not take any over the counter NSAID's such as Advil, ibuprofen, Aleve, or naproxen for 24 hours. You may take tylenol if needed.  I have sent you a prescription for sumatriptan which is a rescue migraine medication for you to try.  Take 1 tablet at onset of your migraine and you may repeat in 2 hours if your symptoms persist.  Do not take more than 2 doses in a 24-hour period.  Please follow-up with your PCP for further migraine treatment options.  Please go to the ER if you develop any worsening symptoms.  I hope you feel better soon!
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
## 2023-03-26 NOTE — ED Provider Notes (Signed)
[facility]    CSN: [id number] Arrival date & time: 03/26/23  1240      History   Chief Complaint Chief Complaint  Patient presents with   Migraine    Entered by patient    HPI [name] is a 22 y.o. female presents for evaluation of a migraine.  Patient reports she woke today with a left-sided headache/migraine that she describes as a throbbing/pressure.  Headache is associated with photosensitivity, nausea, dizziness.  She currently rates the headache as a 9 out of 10 and denies that this is the worst headache of her life.  Denies any syncope, visual changes, unilateral weakness.  Does have a history of migraines.  She does not take any rescue medications for this.  She took 1000 mg of Tylenol 3 hours prior to arrival.  No NSAIDs this morning.  She also states 3 days ago she had cold-like symptoms that have completely resolved.  She does not currently have a PCP concerning this time.   Migraine Associated symptoms include headaches.    History reviewed. No pertinent past medical history.  Patient Active Problem List   Diagnosis Date Noted   Migraine with aura and without status migrainosus 12/13/2017   Migraine without aura and without status migrainosus, not intractable 08/30/2017   Episodic tension-type headache, not intractable 08/30/2017   Morbid obesity (HCC) 08/30/2017   Acanthosis nigricans, acquired 08/30/2017   Appendicitis, acute, with generalized peritonitis 02/05/2014    Past Surgical History:  Procedure Laterality Date   APPENDECTOMY     LAPAROSCOPIC APPENDECTOMY N/A 02/04/2014   Procedure: APPENDECTOMY LAPAROSCOPIC;  Surgeon: Judie Petit. Leonia Corona, MD;  Location: [facility] OR;  Service: Pediatrics;  Laterality: N/A;    OB History   No obstetric history on file.      Home Medications    Prior to Admission medications   Medication Sig Start Date End Date Taking? Authorizing Provider  SUMAtriptan (IMITREX) 100 MG tablet Take 1 tablet (100 mg total)  by mouth as needed for migraine. Take 1 tablet at onset of migraine symptoms and may repeat in 2 hours if headache persists or recurs.  Do not take more than 2 doses in a 24 hours. 03/26/23  Yes Radford Pax, NP  bacitracin ointment Apply 1 Application topically 2 (two) times daily. 01/08/23   Jeannie Fend, PA-C  clindamycin-benzoyl peroxide (BENZACLIN) gel APPLY AA BID 10/15/17   [provider]  cyclobenzaprine (FLEXERIL) 10 MG tablet Take 1 tablet (10 mg total) by mouth 2 (two) times daily as needed for muscle spasms. 11/29/21   Molpus, John, MD  cyproheptadine (PERIACTIN) 4 MG tablet Take 4 mg by mouth 2 (two) times daily.    [provider]  ibuprofen (ADVIL) 400 MG tablet Take 1 tablet (400 mg total) by mouth every 6 (six) hours as needed. 08/22/22   Palumbo, April, MD  metroNIDAZOLE (FLAGYL) 500 MG tablet Take 1 tablet (500 mg total) by mouth 2 (two) times daily. 11/03/22   Wallis Bamberg, PA-C  oxyCODONE-acetaminophen (PERCOCET/ROXICET) 5-325 MG tablet Take 1 tablet by mouth every 6 (six) hours as needed for severe pain. 01/08/23   Jeannie Fend, PA-C    Family History Family History  Problem Relation Age of Onset   Heart disease Father    Heart disease Maternal Grandmother    Hypertension Maternal Grandmother     Social History Social History   Tobacco Use   Smoking status: Never    Passive exposure: Yes  Smokeless tobacco: Never  Vaping Use   Vaping status: Never Used  Substance Use Topics   Alcohol use: No   Drug use: Not Currently    Types: Marijuana     Allergies   Patient has no known allergies.   Review of Systems Review of Systems  Eyes:  Positive for photophobia.  Gastrointestinal:  Positive for nausea.  Neurological:  Positive for dizziness and headaches.     Physical Exam Triage Vital Signs ED Triage Vitals  Encounter Vitals Group     BP 03/26/23 1255 135/85     Systolic BP Percentile --      Diastolic BP Percentile --      Pulse  Rate 03/26/23 1255 66     Resp 03/26/23 1255 16     Temp 03/26/23 1255 97.6 F (36.4 C)     Temp Source 03/26/23 1255 Oral     SpO2 03/26/23 1255 99 %     Weight --      Height --      Head Circumference --      Peak Flow --      Pain Score 03/26/23 1254 8     Pain Loc --      Pain Education --      Exclude from Growth Chart --    No data found.  Updated Vital Signs BP 135/85 (BP Location: Right Arm)   Pulse 66   Temp 97.6 F (36.4 C) (Oral)   Resp 16   LMP 03/25/2023 (Exact Date)   SpO2 99%   Visual Acuity Right Eye Distance:   Left Eye Distance:   Bilateral Distance:    Right Eye Near:   Left Eye Near:    Bilateral Near:     Physical Exam Vitals and nursing note reviewed.  Constitutional:      General: She is not in acute distress.    Appearance: Normal appearance. She is not toxic-appearing or diaphoretic.  HENT:     Head: Normocephalic and atraumatic.  Eyes:     Extraocular Movements: Extraocular movements intact.     Conjunctiva/sclera: Conjunctivae normal.     Pupils: Pupils are equal, round, and reactive to light.  Cardiovascular:     Rate and Rhythm: Normal rate.  Pulmonary:     Effort: Pulmonary effort is normal.  Skin:    General: Skin is warm and dry.  Neurological:     General: No focal deficit present.     Mental Status: She is alert and oriented to person, place, and time.     GCS: GCS eye subscore is 4. GCS verbal subscore is 5. GCS motor subscore is 6.     Cranial Nerves: No facial asymmetry.     Motor: No weakness or pronator drift.     Coordination: Romberg sign negative. Coordination normal.  Psychiatric:        Mood and Affect: Mood normal.        Behavior: Behavior normal.      UC Treatments / Results  Labs (all labs ordered are listed, but only abnormal results are displayed) Labs Reviewed - No data to display  EKG   Radiology No results found.  Procedures Procedures (including critical care time)  Medications  Ordered in UC Medications  ketorolac (TORADOL) 30 MG/ML injection 30 mg (30 mg Intramuscular Given 03/26/23 1316)    Initial Impression / Assessment and Plan / UC Course  I have reviewed the triage vital signs and the nursing notes.  Pertinent labs & imaging results that were available during my care of the patient were reviewed by me and considered in my medical decision making (see chart for details).     Reviewed symptoms no red flags.  Patient with history of migraines presenting with acute headache that began today.  Neuroexam unremarkable.  Patient denies worsening of life.  She was given Toradol injection in clinic.  She was monitored for 10 minutes after injection with no reaction noted and tolerated well. Reports improvement in HA.   She was instructed no NSAIDs for 24 hours and verbalized understanding.  She does not currently have a PCP, nursing staff was able to set her up with plan to discuss additional migraine management/rescue medications. Rx sumatriptan for pt to try for migraines.  Advised to keep a headache diary and take to her PCP.  Strict ER precautions were reviewed and patient verbalized understanding. Final Clinical Impressions(s) / UC Diagnoses   Final diagnoses:  Other migraine without status migrainosus, intractable     Discharge Instructions      You were given a Toradol injection in clinic today. Do not take any over the counter NSAID's such as Advil, ibuprofen, Aleve, or naproxen for 24 hours. You may take tylenol if needed.  I have sent you a prescription for sumatriptan which is a rescue migraine medication for you to try.  Take 1 tablet at onset of your migraine and you may repeat in 2 hours if your symptoms persist.  Do not take more than 2 doses in a 24-hour period.  Please follow-up with your PCP for further migraine treatment options.  Please go to the ER if you develop any worsening symptoms.  I hope you feel better soon!      ED Prescriptions      Medication Sig Dispense Auth. Provider   SUMAtriptan (IMITREX) 100 MG tablet Take 1 tablet (100 mg total) by mouth as needed for migraine. Take 1 tablet at onset of migraine symptoms and may repeat in 2 hours if headache persists or recurs.  Do not take more than 2 doses in a 24 hours. 2 tablet Radford Pax, NP      PDMP not reviewed this encounter.   Radford Pax, NP 03/26/23 1336

## 2023-04-04 ENCOUNTER — Ambulatory Visit: Payer: No Typology Code available for payment source | Admitting: Family Medicine

## 2023-05-16 ENCOUNTER — Encounter: Payer: Self-pay | Admitting: Family Medicine

## 2023-05-16 ENCOUNTER — Ambulatory Visit (INDEPENDENT_AMBULATORY_CARE_PROVIDER_SITE_OTHER): Payer: No Typology Code available for payment source | Admitting: Family Medicine

## 2023-05-16 VITALS — BP 112/80 | HR 59 | Temp 98.8°F | Ht 63.0 in | Wt 254.0 lb

## 2023-05-16 DIAGNOSIS — F411 Generalized anxiety disorder: Secondary | ICD-10-CM

## 2023-05-16 DIAGNOSIS — G43009 Migraine without aura, not intractable, without status migrainosus: Secondary | ICD-10-CM | POA: Diagnosis not present

## 2023-05-16 DIAGNOSIS — Z1159 Encounter for screening for other viral diseases: Secondary | ICD-10-CM | POA: Diagnosis not present

## 2023-05-16 DIAGNOSIS — Z114 Encounter for screening for human immunodeficiency virus [HIV]: Secondary | ICD-10-CM

## 2023-05-16 DIAGNOSIS — R739 Hyperglycemia, unspecified: Secondary | ICD-10-CM

## 2023-05-16 DIAGNOSIS — L2082 Flexural eczema: Secondary | ICD-10-CM

## 2023-05-16 LAB — POCT GLYCOSYLATED HEMOGLOBIN (HGB A1C): Hemoglobin A1C: 5.3 % (ref 4.0–5.6)

## 2023-05-16 LAB — TSH: TSH: 1.04 u[IU]/mL (ref 0.35–5.50)

## 2023-05-16 MED ORDER — TRIAMCINOLONE ACETONIDE 0.1 % EX CREA
1.0000 | TOPICAL_CREAM | Freq: Two times a day (BID) | CUTANEOUS | 2 refills | Status: DC
Start: 2023-05-16 — End: 2023-05-30

## 2023-05-16 MED ORDER — VENLAFAXINE HCL ER 75 MG PO CP24: 75.0000 mg | ORAL_CAPSULE | Freq: Every day | ORAL | 2 refills | Status: DC

## 2023-05-16 MED ORDER — AMITRIPTYLINE HCL 10 MG PO TABS
10.0000 mg | ORAL_TABLET | Freq: Every day | ORAL | 2 refills | Status: DC
Start: 2023-05-16 — End: 2023-06-07
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
## 2023-05-16 NOTE — Assessment & Plan Note (Signed)
FAILED sumatriptan therapy, I have given her samples of nurtec 75 mg PRN acute migraine to try. She will start amitriptyline at night for daily prevention therapy. RTC in 6 weeks for video visit to follow up.

## 2023-05-16 NOTE — Assessment & Plan Note (Signed)
Pt requesting refills on her triamcinolone cream, states she gets patches behind her neck, ears, knees, also in the crook of her elbows. Usually well controlled with steroid cream PRN, will refill for her

## 2023-05-16 NOTE — Assessment & Plan Note (Signed)
Severe on PHQ and GAD scoring. We discussed medication and we will start effexor 75 mg daily, this will also help with her chronic headache disorder and be less likely to cause weight gain. Will see her back short term in 6 weeks to re-evaluate her symptoms.

## 2023-05-16 NOTE — Patient Instructions (Signed)
Melatonin 5 mg -- can take up to 20 mg at night if needed for sleep-- available over the counter

## 2023-05-17 LAB — HIV ANTIBODY (ROUTINE TESTING W REFLEX): HIV 1&2 Ab, 4th Generation: NONREACTIVE

## 2023-05-17 LAB — HEPATITIS C ANTIBODY: Hepatitis C Ab: NONREACTIVE

## 2023-05-29 ENCOUNTER — Encounter: Payer: Self-pay | Admitting: Family Medicine

## 2023-05-29 DIAGNOSIS — L2082 Flexural eczema: Secondary | ICD-10-CM

## 2023-06-03 MED ORDER — BETAMETHASONE VALERATE 0.1 % EX OINT
1.0000 | TOPICAL_OINTMENT | Freq: Two times a day (BID) | CUTANEOUS | 0 refills | Status: DC
Start: 2023-06-03 — End: 2023-08-11

## 2023-06-07 ENCOUNTER — Other Ambulatory Visit: Payer: Self-pay | Admitting: Family Medicine

## 2023-06-07 ENCOUNTER — Ambulatory Visit: Payer: Medicaid Other | Admitting: Family Medicine

## 2023-06-07 DIAGNOSIS — G43009 Migraine without aura, not intractable, without status migrainosus: Secondary | ICD-10-CM

## 2023-06-07 DIAGNOSIS — F411 Generalized anxiety disorder: Secondary | ICD-10-CM

## 2023-06-28 ENCOUNTER — Telehealth (INDEPENDENT_AMBULATORY_CARE_PROVIDER_SITE_OTHER): Payer: Self-pay | Admitting: Family Medicine

## 2023-06-28 DIAGNOSIS — F411 Generalized anxiety disorder: Secondary | ICD-10-CM

## 2023-06-28 NOTE — Progress Notes (Signed)
 Called pt, left VM, pt did not joint the call

## 2023-07-26 ENCOUNTER — Emergency Department (HOSPITAL_BASED_OUTPATIENT_CLINIC_OR_DEPARTMENT_OTHER)
Admission: EM | Admit: 2023-07-26 | Discharge: 2023-07-26 | Disposition: A | Discharge status: Home / Self Care | Payer: No Typology Code available for payment source | Attending: Emergency Medicine | Admitting: Emergency Medicine

## 2023-07-26 ENCOUNTER — Encounter (HOSPITAL_BASED_OUTPATIENT_CLINIC_OR_DEPARTMENT_OTHER): Payer: Self-pay | Admitting: Emergency Medicine

## 2023-07-26 ENCOUNTER — Other Ambulatory Visit (HOSPITAL_BASED_OUTPATIENT_CLINIC_OR_DEPARTMENT_OTHER): Payer: Self-pay

## 2023-07-26 ENCOUNTER — Other Ambulatory Visit: Payer: Self-pay

## 2023-07-26 ENCOUNTER — Emergency Department (HOSPITAL_BASED_OUTPATIENT_CLINIC_OR_DEPARTMENT_OTHER): Payer: No Typology Code available for payment source

## 2023-07-26 DIAGNOSIS — S161XXA Strain of muscle, fascia and tendon at neck level, initial encounter: Secondary | ICD-10-CM | POA: Insufficient documentation

## 2023-07-26 DIAGNOSIS — Y92410 Unspecified street and highway as the place of occurrence of the external cause: Secondary | ICD-10-CM | POA: Diagnosis not present

## 2023-07-26 DIAGNOSIS — S199XXA Unspecified injury of neck, initial encounter: Secondary | ICD-10-CM | POA: Diagnosis present

## 2023-07-26 DIAGNOSIS — R519 Headache, unspecified: Secondary | ICD-10-CM | POA: Diagnosis not present

## 2023-07-26 HISTORY — DX: Migraine, unspecified, not intractable, without status migrainosus: G43.909

## 2023-07-26 MED ORDER — CYCLOBENZAPRINE HCL 10 MG PO TABS
10.0000 mg | ORAL_TABLET | Freq: Two times a day (BID) | ORAL | 0 refills | Status: DC | PRN
Start: 2023-07-26 — End: 2023-10-24
  Filled 2023-07-26: qty 10, 5d supply, fill #0
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
## 2023-08-08 ENCOUNTER — Other Ambulatory Visit (HOSPITAL_BASED_OUTPATIENT_CLINIC_OR_DEPARTMENT_OTHER): Payer: Self-pay

## 2023-08-11 ENCOUNTER — Other Ambulatory Visit: Payer: Self-pay | Admitting: Family Medicine

## 2023-08-11 DIAGNOSIS — L2082 Flexural eczema: Secondary | ICD-10-CM

## 2023-09-19 ENCOUNTER — Other Ambulatory Visit: Payer: Self-pay | Admitting: Family Medicine

## 2023-09-19 DIAGNOSIS — L2082 Flexural eczema: Secondary | ICD-10-CM

## 2023-10-20 ENCOUNTER — Other Ambulatory Visit: Payer: Self-pay | Admitting: Family Medicine

## 2023-10-20 DIAGNOSIS — L2082 Flexural eczema: Secondary | ICD-10-CM

## 2023-10-21 ENCOUNTER — Ambulatory Visit: Admitting: Family Medicine

## 2023-10-24 ENCOUNTER — Ambulatory Visit (INDEPENDENT_AMBULATORY_CARE_PROVIDER_SITE_OTHER): Admitting: Family Medicine

## 2023-10-24 ENCOUNTER — Encounter: Payer: Self-pay | Admitting: Family Medicine

## 2023-10-24 VITALS — BP 128/78 | HR 90 | Temp 98.4°F | Ht 64.0 in | Wt 238.5 lb

## 2023-10-24 DIAGNOSIS — Z13220 Encounter for screening for lipoid disorders: Secondary | ICD-10-CM | POA: Diagnosis not present

## 2023-10-24 DIAGNOSIS — R739 Hyperglycemia, unspecified: Secondary | ICD-10-CM | POA: Diagnosis not present

## 2023-10-24 DIAGNOSIS — Z0000 Encounter for general adult medical examination without abnormal findings: Secondary | ICD-10-CM

## 2023-10-24 DIAGNOSIS — E559 Vitamin D deficiency, unspecified: Secondary | ICD-10-CM | POA: Diagnosis not present

## 2023-10-24 DIAGNOSIS — G43009 Migraine without aura, not intractable, without status migrainosus: Secondary | ICD-10-CM

## 2023-10-24 MED ORDER — AMITRIPTYLINE HCL 10 MG PO TABS
10.0000 mg | ORAL_TABLET | Freq: Every day | ORAL | 1 refills | Status: DC
Start: 2023-10-24 — End: 2024-05-15

## 2023-10-24 NOTE — Progress Notes (Signed)
 Complete physical exam  Patient: [name]   DOB: 2001-03-09   23 y.o. Female  MRN: [id number]  Subjective:    Chief Complaint  Patient presents with   Annual Exam    [name] is a 23 y.o. female who presents today for a complete physical exam. She reports consuming a general diet.  Works 2 jobs so has little time for exercise, works shifts at [employer] and this job is very active.   She generally feels well. She reports sleeping fairly well. She does not have additional problems to discuss today.    Most recent fall risk assessment:     No data to display           Most recent depression screenings:    10/24/2023    1:25 PM 05/16/2023   11:38 AM  PHQ 2/9 Scores  PHQ - 2 Score 0 3  PHQ- 9 Score 4 12    Vision:Within last year and Dental: No current dental problems and Receives regular dental care  Patient Active Problem List   Diagnosis Date Noted   Generalized anxiety disorder 05/16/2023   Flexural eczema 05/16/2023   Migraine with aura and without status migrainosus 12/13/2017   Migraine without aura and without status migrainosus, not intractable 08/30/2017   Episodic tension-type headache, not intractable 08/30/2017   Morbid obesity (HCC) 08/30/2017   Acanthosis nigricans, acquired 08/30/2017   Appendicitis, acute, with generalized peritonitis 02/05/2014      Patient Care Team: Aida House, MD as PCP - General (Family Medicine)   Outpatient Medications Prior to Visit  Medication Sig   betamethasone  valerate ointment (VALISONE ) 0.1 % APPLY TO AFFECTED AREA TWICE A DAY   ibuprofen  (ADVIL ) 400 MG tablet Take 1 tablet (400 mg total) by mouth every 6 (six) hours as needed.   [DISCONTINUED] amitriptyline  (ELAVIL ) 10 MG tablet TAKE 1 TABLET BY MOUTH EVERYDAY AT BEDTIME   [DISCONTINUED] cyclobenzaprine  (FLEXERIL ) 10 MG tablet Take 1 tablet (10 mg total) by mouth 2 (two) times daily as needed for muscle spasms.   [DISCONTINUED] venlafaxine  XR  (EFFEXOR -XR) 75 MG 24 hr capsule TAKE 1 CAPSULE BY MOUTH DAILY WITH BREAKFAST.   No facility-administered medications prior to visit.    Review of Systems  HENT:  Negative for hearing loss.   Eyes:  Negative for blurred vision.  Respiratory:  Negative for shortness of breath.   Cardiovascular:  Negative for chest pain.  Gastrointestinal: Negative.   Genitourinary: Negative.   Musculoskeletal:  Negative for back pain.  Neurological:  Negative for headaches.  Psychiatric/Behavioral:  Negative for depression.        Objective:     BP 128/78   Pulse 90   Temp 98.4 F (36.9 C) (Oral)   Ht 5\' 4"  (1.626 m)   Wt 238 lb 8 oz (108.2 kg)   LMP 10/19/2023 (Exact Date)   SpO2 99%   BMI 40.94 kg/m    Physical Exam Vitals reviewed.  Constitutional:      Appearance: Normal appearance. She is well-groomed. She is morbidly obese.  HENT:     Right Ear: Tympanic membrane and ear canal normal.     Left Ear: Tympanic membrane and ear canal normal.     Mouth/Throat:     Mouth: Mucous membranes are moist.     Pharynx: No posterior oropharyngeal erythema.  Eyes:     Conjunctiva/sclera: Conjunctivae normal.  Neck:     Thyroid : No thyromegaly.  Cardiovascular:  Rate and Rhythm: Normal rate and regular rhythm.     Pulses: Normal pulses.     Heart sounds: S1 normal and S2 normal.  Pulmonary:     Effort: Pulmonary effort is normal.     Breath sounds: Normal breath sounds and air entry.  Abdominal:     General: Abdomen is flat. Bowel sounds are normal.     Palpations: Abdomen is soft.  Musculoskeletal:     Right lower leg: No edema.     Left lower leg: No edema.  Lymphadenopathy:     Cervical: No cervical adenopathy.  Neurological:     Mental Status: She is alert and oriented to person, place, and time. Mental status is at baseline.     Gait: Gait is intact.  Psychiatric:        Mood and Affect: Mood and affect normal.        Speech: Speech normal.        Behavior: Behavior  normal.        Judgment: Judgment normal.      No results found for any visits on 10/24/23.     Assessment & Plan:    Routine Health Maintenance and Physical Exam  Immunization History  Administered Date(s) Administered   Tdap 11/29/2021    Health Maintenance  Topic Date Due   HPV VACCINES (1 - 3-dose series) Never done   Meningococcal B Vaccine (1 of 2 - Standard) Never done   COVID-19 Vaccine (1 - 2024-25 season) Never done   Cervical Cancer Screening (Pap smear)  Never done   CHLAMYDIA SCREENING  11/03/2023   INFLUENZA VACCINE  01/20/2024   DTaP/Tdap/Td (2 - Td or Tdap) 11/30/2031   Hepatitis C Screening  Completed   HIV Screening  Completed    Discussed health benefits of physical activity, and encouraged her to engage in regular exercise appropriate for her age and condition.  Routine general medical examination at a health care facility Normal physical exam findings. I counseled the patient on the pap smear, pt has had an IUD in the past, is only using barrier methods. I counseled the patient on non hormonal IUD. Will have her back for pap smear and IUD pplacement. Handouts given on healthy eating and exercise, labs ordered for annual surveillance.   -     CBC with Differential/Platelet; Future -     Comprehensive metabolic panel with GFR; Future  Hyperglycemia -     Hemoglobin A1c; Future  Vitamin D deficiency -     VITAMIN D 25 Hydroxy (Vit-D Deficiency, Fractures); Future  Lipid screening -     Lipid panel; Future  Migraine without aura and without status migrainosus, not intractable -     Amitriptyline  HCl; Take 1 tablet (10 mg total) by mouth at bedtime.  Dispense: 90 tablet; Refill: 1    Return for Paragard placement with pap .     Aida House, MD

## 2023-10-24 NOTE — Patient Instructions (Signed)

## 2023-11-01 ENCOUNTER — Ambulatory Visit: Admitting: Family Medicine

## 2023-11-03 ENCOUNTER — Inpatient Hospital Stay: Admitting: Family Medicine

## 2023-11-25 ENCOUNTER — Other Ambulatory Visit (HOSPITAL_COMMUNITY)
Admission: RE | Admit: 2023-11-25 | Discharge: 2023-11-25 | Disposition: A | Discharge status: Home / Self Care | Source: Ambulatory Visit | Attending: Family Medicine | Admitting: Family Medicine

## 2023-11-25 ENCOUNTER — Encounter: Payer: Self-pay | Admitting: Family Medicine

## 2023-11-25 ENCOUNTER — Ambulatory Visit (INDEPENDENT_AMBULATORY_CARE_PROVIDER_SITE_OTHER): Admitting: Family Medicine

## 2023-11-25 VITALS — BP 118/70 | HR 71 | Temp 98.3°F | Ht 64.0 in | Wt 234.6 lb

## 2023-11-25 DIAGNOSIS — R739 Hyperglycemia, unspecified: Secondary | ICD-10-CM | POA: Diagnosis not present

## 2023-11-25 DIAGNOSIS — Z13220 Encounter for screening for lipoid disorders: Secondary | ICD-10-CM

## 2023-11-25 DIAGNOSIS — Z209 Contact with and (suspected) exposure to unspecified communicable disease: Secondary | ICD-10-CM | POA: Diagnosis not present

## 2023-11-25 DIAGNOSIS — Z124 Encounter for screening for malignant neoplasm of cervix: Secondary | ICD-10-CM | POA: Insufficient documentation

## 2023-11-25 DIAGNOSIS — Z0000 Encounter for general adult medical examination without abnormal findings: Secondary | ICD-10-CM

## 2023-11-25 DIAGNOSIS — E559 Vitamin D deficiency, unspecified: Secondary | ICD-10-CM | POA: Diagnosis not present

## 2023-11-25 LAB — LIPID PANEL
Cholesterol: 134 mg/dL (ref 0–200)
HDL: 48.2 mg/dL (ref >39.00)
LDL Cholesterol: 77 mg/dL (ref 0–99)
NonHDL: 85.5
Total CHOL/HDL Ratio: 3
Triglycerides: 44 mg/dL (ref 0.0–149.0)
VLDL: 8.8 mg/dL (ref 0.0–40.0)

## 2023-11-25 LAB — COMPREHENSIVE METABOLIC PANEL WITH GFR
ALT: 10 U/L (ref 0–35)
AST: 14 U/L (ref 0–37)
Albumin: 4.2 g/dL (ref 3.5–5.2)
Alkaline Phosphatase: 70 U/L (ref 39–117)
BUN: 10 mg/dL (ref 6–23)
CO2: 28 meq/L (ref 19–32)
Calcium: 9.3 mg/dL (ref 8.4–10.5)
Chloride: 105 meq/L (ref 96–112)
Creatinine, Ser: 0.98 mg/dL (ref 0.40–1.20)
GFR: 82.58 mL/min (ref >60.00)
Glucose, Bld: 82 mg/dL (ref 70–99)
Potassium: 3.8 meq/L (ref 3.5–5.1)
Sodium: 139 meq/L (ref 135–145)
Total Bilirubin: 0.6 mg/dL (ref 0.2–1.2)
Total Protein: 7.2 g/dL (ref 6.0–8.3)

## 2023-11-25 LAB — CBC WITH DIFFERENTIAL/PLATELET
Basophils Absolute: 0 10*3/uL (ref 0.0–0.1)
Basophils Relative: 0.6 % (ref 0.0–3.0)
Eosinophils Absolute: 0 10*3/uL (ref 0.0–0.7)
Eosinophils Relative: 0.8 % (ref 0.0–5.0)
HCT: 39.9 % (ref 36.0–46.0)
Hemoglobin: 13.1 g/dL (ref 12.0–15.0)
Lymphocytes Relative: 21.5 % (ref 12.0–46.0)
Lymphs Abs: 1 10*3/uL (ref 0.7–4.0)
MCHC: 32.8 g/dL (ref 30.0–36.0)
MCV: 79.8 fl (ref 78.0–100.0)
Monocytes Absolute: 0.3 10*3/uL (ref 0.1–1.0)
Monocytes Relative: 7 % (ref 3.0–12.0)
Neutro Abs: 3.4 10*3/uL (ref 1.4–7.7)
Neutrophils Relative %: 70.1 % (ref 43.0–77.0)
Platelets: 171 10*3/uL (ref 150.0–400.0)
RBC: 5 Mil/uL (ref 3.87–5.11)
RDW: 13.7 % (ref 11.5–15.5)
WBC: 4.8 10*3/uL (ref 4.0–10.5)

## 2023-11-25 LAB — HEMOGLOBIN A1C: Hgb A1c MFr Bld: 5.1 % (ref 4.6–6.5)

## 2023-11-25 NOTE — Patient Instructions (Signed)
 Ibuprofen 800mg  every 8 hours- take with food

## 2023-11-25 NOTE — Progress Notes (Signed)
 Established Patient Office Visit  Subjective   Patient ID: [name], female    DOB: 2000-10-30  Age: 23 y.o. MRN: [id number]  Chief Complaint  Patient presents with   Medical Management of Chronic Issues   Patient requests STD testing-denies exposure    Pt is here today for her pap smear. She is also requesting STD testing as well today. Last period was 5/31, no usually vaginal discharge, no pelvic pain. States that her periods are very heavy, uses overnight pads, about 4 times per day. Periods are painful.   Current Outpatient Medications  Medication Instructions   amitriptyline  (ELAVIL ) 10 mg, Oral, Daily at bedtime   betamethasone  valerate ointment (VALISONE ) 0.1 % APPLY TO AFFECTED AREA TWICE A DAY   ibuprofen  (ADVIL ) 400 mg, Oral, Every 6 hours PRN    Patient Active Problem List   Diagnosis Date Noted   Generalized anxiety disorder 05/16/2023   Flexural eczema 05/16/2023   Migraine with aura and without status migrainosus 12/13/2017   Migraine without aura and without status migrainosus, not intractable 08/30/2017   Episodic tension-type headache, not intractable 08/30/2017   Morbid obesity (HCC) 08/30/2017   Acanthosis nigricans, acquired 08/30/2017   Appendicitis, acute, with generalized peritonitis 02/05/2014      Review of Systems  All other systems reviewed and are negative.     Objective:     BP 118/70   Pulse 71   Temp 98.3 F (36.8 C) (Oral)   Ht 5\' 4"  (1.626 m)   Wt 234 lb 9.6 oz (106.4 kg)   LMP 11/18/2023 (Exact Date)   SpO2 98%   BMI 40.27 kg/m    Physical Exam Vitals reviewed. Exam conducted with a chaperone present.  Constitutional:      Appearance: She is morbidly obese.  Pulmonary:     Effort: Pulmonary effort is normal.  Abdominal:     General: Bowel sounds are normal.     Palpations: Abdomen is soft.  Genitourinary:    General: Normal vulva.     Exam position: Lithotomy position.     Tanner stage (genital): 5.      Vagina: Normal.     Cervix: Normal.     Uterus: Normal.      Adnexa: Right adnexa normal and left adnexa normal.     Rectum: Normal.  Neurological:     General: No focal deficit present.     Mental Status: She is alert and oriented to person, place, and time.  Psychiatric:        Mood and Affect: Mood and affect normal.     No results found for any visits on 11/25/23.    The ASCVD Risk score (Arnett DK, et al., 2019) failed to calculate for the following reasons:   The 2019 ASCVD risk score is only valid for ages 49 to 5    Assessment & Plan:  Encounter for Papanicolaou smear for cervical cancer screening -     Cytology - PAP  Exposure to communicable disease -     Cervicovaginal ancillary only -     HSV(herpes simplex vrs) 1+2 ab-IgG; Future -     RPR; Future  Vitamin D deficiency -     VITAMIN D 25 Hydroxy (Vit-D Deficiency, Fractures); Future  Lipid screening -     Lipid panel; Future   Normal physical exam findings. Patient was counseled on birth control options, she wants to wait as she has had issues in the past with hormonal contraceptives. Using barrier  method currently. Pap sent and STI testing sent to lab.   Return in about 1 year (around 11/24/2024) for annual physical exam.    Aida House, MD

## 2023-11-28 ENCOUNTER — Ambulatory Visit: Payer: Self-pay | Admitting: Family Medicine

## 2023-11-28 DIAGNOSIS — E559 Vitamin D deficiency, unspecified: Secondary | ICD-10-CM

## 2023-11-28 LAB — CERVICOVAGINAL ANCILLARY ONLY
Bacterial Vaginitis (gardnerella): NEGATIVE
Candida Glabrata: NEGATIVE
Candida Vaginitis: NEGATIVE
Chlamydia: NEGATIVE
Comment: NEGATIVE
Comment: NEGATIVE
Comment: NEGATIVE
Comment: NEGATIVE
Comment: NEGATIVE
Comment: NORMAL
Neisseria Gonorrhea: NEGATIVE
Trichomonas: NEGATIVE

## 2023-11-28 LAB — CYTOLOGY - PAP
Comment: NEGATIVE
Diagnosis: NEGATIVE
High risk HPV: NEGATIVE

## 2023-11-29 LAB — HSV(HERPES SIMPLEX VRS) I + II AB-IGG
HSV 1 IGG,TYPE SPECIFIC AB: <0.9 {index}
HSV 2 IGG,TYPE SPECIFIC AB: <0.9 {index}

## 2023-11-29 LAB — RPR: RPR Ser Ql: NONREACTIVE

## 2023-12-01 LAB — VITAMIN D 25 HYDROXY (VIT D DEFICIENCY, FRACTURES): VITD: 10.87 ng/mL — ABNORMAL LOW (ref 30.00–100.00)

## 2023-12-02 MED ORDER — VITAMIN D (ERGOCALCIFEROL) 1.25 MG (50000 UNIT) PO CAPS
50000.0000 [IU] | ORAL_CAPSULE | ORAL | 1 refills | Status: DC
Start: 2023-12-02 — End: 2024-05-15

## 2023-12-13 ENCOUNTER — Ambulatory Visit
Admission: RE | Admit: 2023-12-13 | Discharge: 2023-12-13 | Disposition: A | Discharge status: Home / Self Care | Source: Ambulatory Visit | Attending: Family Medicine | Admitting: Family Medicine

## 2023-12-13 VITALS — BP 99/74 | HR 88 | Temp 98.8°F | Resp 16

## 2023-12-13 DIAGNOSIS — Z113 Encounter for screening for infections with a predominantly sexual mode of transmission: Secondary | ICD-10-CM | POA: Insufficient documentation

## 2023-12-13 DIAGNOSIS — N760 Acute vaginitis: Secondary | ICD-10-CM | POA: Insufficient documentation

## 2023-12-13 MED ORDER — FLUCONAZOLE 150 MG PO TABS
150.0000 mg | ORAL_TABLET | Freq: Every day | ORAL | 0 refills | Status: DC
Start: 2023-12-13 — End: 2024-05-15

## 2023-12-13 NOTE — Discharge Instructions (Addendum)
 The clinic will contact you with results of the testing done today if positive.  Start Diflucan as prescribed.  Please follow-up with your PCP or gynecologist if symptoms do not improve.  Please go to the ER for any worsening symptoms.  Hope you feel better soon!

## 2023-12-13 NOTE — ED Provider Notes (Signed)
 [facility]    CSN: [id number] Arrival date & time: 12/13/23  1716      History   Chief Complaint Chief Complaint  Patient presents with   Vaginal Discharge    Entered by patient    HPI [name] is a 23 y.o. female presents for vaginal irritation.  Patient reports 2 days of vaginal itching/irritation with mild discharge.  States she used a different body wash and thinks that had contributed.  Denies any rashes, dysuria, fevers, nausea/vomiting, flank pain.  No known STD exposure but would like screening.  No OTC treatments have been used since onset.  No other complaints at this time.   Vaginal Discharge   Past Medical History:  Diagnosis Date   Migraines     Patient Active Problem List   Diagnosis Date Noted   Generalized anxiety disorder 05/16/2023   Flexural eczema 05/16/2023   Migraine with aura and without status migrainosus 12/13/2017   Migraine without aura and without status migrainosus, not intractable 08/30/2017   Episodic tension-type headache, not intractable 08/30/2017   Morbid obesity (HCC) 08/30/2017   Acanthosis nigricans, acquired 08/30/2017   Appendicitis, acute, with generalized peritonitis 02/05/2014    Past Surgical History:  Procedure Laterality Date   APPENDECTOMY     LAPAROSCOPIC APPENDECTOMY N/A 02/04/2014   Procedure: APPENDECTOMY LAPAROSCOPIC;  Surgeon: CHRISTELLA. Julietta Millman, MD;  Location: [facility] OR;  Service: Pediatrics;  Laterality: N/A;    OB History   No obstetric history on file.      Home Medications    Prior to Admission medications   Medication Sig Start Date End Date Taking? Authorizing Provider  fluconazole  (DIFLUCAN ) 150 MG tablet Take 1 tablet (150 mg total) by mouth daily. Take 1 tablet today and may repeat in 3 days if symptoms persist 12/13/23  Yes Loreda Myla SAUNDERS, NP  amitriptyline  (ELAVIL ) 10 MG tablet Take 1 tablet (10 mg total) by mouth at bedtime. 10/24/23   Ozell Heron CHRISTELLA, MD  betamethasone  valerate  ointment (VALISONE ) 0.1 % APPLY TO AFFECTED AREA TWICE A DAY 10/20/23   Ozell Heron CHRISTELLA, MD  ibuprofen  (ADVIL ) 400 MG tablet Take 1 tablet (400 mg total) by mouth every 6 (six) hours as needed. 08/22/22   Palumbo, April, MD  Vitamin D , Ergocalciferol , (DRISDOL ) 1.25 MG (50000 UNIT) CAPS capsule Take 1 capsule (50,000 Units total) by mouth every 7 (seven) days. 12/02/23   Ozell Heron CHRISTELLA, MD    Family History Family History  Problem Relation Age of Onset   Heart disease Father    Heart disease Maternal Grandmother    Hypertension Maternal Grandmother     Social History Social History   Tobacco Use   Smoking status: Never    Passive exposure: Yes   Smokeless tobacco: Never  Vaping Use   Vaping status: Never Used  Substance Use Topics   Alcohol use: No   Drug use: Yes    Types: Marijuana     Allergies   Patient has no known allergies.   Review of Systems Review of Systems  Genitourinary:  Positive for vaginal discharge.     Physical Exam Triage Vital Signs ED Triage Vitals  Encounter Vitals Group     BP 12/13/23 1723 99/74     Girls Systolic BP Percentile --      Girls Diastolic BP Percentile --      Boys Systolic BP Percentile --      Boys Diastolic BP Percentile --  Pulse Rate 12/13/23 1723 88     Resp 12/13/23 1723 16     Temp 12/13/23 1723 98.8 F (37.1 C)     Temp Source 12/13/23 1723 Oral     SpO2 12/13/23 1723 98 %     Weight --      Height --      Head Circumference --      Peak Flow --      Pain Score 12/13/23 1722 2     Pain Loc --      Pain Education --      Exclude from Growth Chart --    No data found.  Updated Vital Signs BP 99/74 (BP Location: Right Arm)   Pulse 88   Temp 98.8 F (37.1 C) (Oral)   Resp 16   LMP 11/18/2023 (Exact Date)   SpO2 98%   Visual Acuity Right Eye Distance:   Left Eye Distance:   Bilateral Distance:    Right Eye Near:   Left Eye Near:    Bilateral Near:     Physical Exam Vitals and nursing  note reviewed.  Constitutional:      Appearance: Normal appearance.  HENT:     Head: Normocephalic and atraumatic.   Eyes:     Pupils: Pupils are equal, round, and reactive to light.    Cardiovascular:     Rate and Rhythm: Normal rate.  Pulmonary:     Effort: Pulmonary effort is normal.   Skin:    General: Skin is warm and dry.   Neurological:     General: No focal deficit present.     Mental Status: She is alert and oriented to person, place, and time.   Psychiatric:        Mood and Affect: Mood normal.        Behavior: Behavior normal.      UC Treatments / Results  Labs (all labs ordered are listed, but only abnormal results are displayed) Labs Reviewed  RPR  HIV ANTIBODY (ROUTINE TESTING W REFLEX)  CERVICOVAGINAL ANCILLARY ONLY    EKG   Radiology No results found.  Procedures Procedures (including critical care time)  Medications Ordered in UC Medications - No data to display  Initial Impression / Assessment and Plan / UC Course  I have reviewed the triage vital signs and the nursing notes.  Pertinent labs & imaging results that were available during my care of the patient were reviewed by me and considered in my medical decision making (see chart for details).     Reviewed exam and symptoms with patient.  No red flags.  STD testing/vaginal swab was ordered and will contact for any positive results.  Will start Diflucan  to treat yeast infection with symptoms awaiting results.  Discussed rest fluids and PCP or gynecology follow-up if symptoms do not improve.  ER precautions reviewed. Final Clinical Impressions(s) / UC Diagnoses   Final diagnoses:  Acute vaginitis  Screening examination for STD (sexually transmitted disease)     Discharge Instructions      The clinic will contact you with results of the testing done today if positive.  Start Diflucan  as prescribed.  Please follow-up with your PCP or gynecologist if symptoms do not improve.  Please  go to the ER for any worsening symptoms.  Hope you feel better soon!    ED Prescriptions     Medication Sig Dispense Auth. Provider   fluconazole  (DIFLUCAN ) 150 MG tablet Take 1 tablet (150 mg total) by  mouth daily. Take 1 tablet today and may repeat in 3 days if symptoms persist 2 tablet Sandy Blouch, Jodi R, NP      PDMP not reviewed this encounter.   Loreda Myla SAUNDERS, NP 12/13/23 [phone]

## 2023-12-13 NOTE — ED Triage Notes (Signed)
 Pt present with concerns of possible yeast infection. She recently switched body washes. States she has vaginal itching and tingling, pain at the opening and c/o discharge.

## 2023-12-14 ENCOUNTER — Ambulatory Visit (HOSPITAL_COMMUNITY): Payer: Self-pay

## 2023-12-14 LAB — CERVICOVAGINAL ANCILLARY ONLY
Bacterial Vaginitis (gardnerella): NEGATIVE
Candida Glabrata: NEGATIVE
Candida Vaginitis: POSITIVE — AB
Chlamydia: NEGATIVE
Comment: NEGATIVE
Comment: NEGATIVE
Comment: NEGATIVE
Comment: NEGATIVE
Comment: NEGATIVE
Comment: NORMAL
Neisseria Gonorrhea: NEGATIVE
Trichomonas: NEGATIVE

## 2023-12-14 LAB — RPR: RPR Ser Ql: NONREACTIVE

## 2023-12-14 LAB — HIV ANTIBODY (ROUTINE TESTING W REFLEX): HIV Screen 4th Generation wRfx: NONREACTIVE

## 2023-12-21 ENCOUNTER — Ambulatory Visit
Admission: RE | Admit: 2023-12-21 | Discharge: 2023-12-21 | Disposition: A | Discharge status: Home / Self Care | Source: Ambulatory Visit | Attending: Family Medicine | Admitting: Family Medicine

## 2023-12-21 VITALS — BP 112/72 | HR 75 | Temp 98.5°F | Resp 17

## 2023-12-21 DIAGNOSIS — N898 Other specified noninflammatory disorders of vagina: Secondary | ICD-10-CM

## 2023-12-21 DIAGNOSIS — L292 Pruritus vulvae: Secondary | ICD-10-CM | POA: Diagnosis not present

## 2023-12-21 LAB — POCT URINALYSIS DIP (MANUAL ENTRY)
Bilirubin, UA: NEGATIVE
Blood, UA: NEGATIVE
Glucose, UA: NEGATIVE mg/dL
Ketones, POC UA: NEGATIVE mg/dL
Leukocytes, UA: NEGATIVE
Nitrite, UA: NEGATIVE
Protein Ur, POC: NEGATIVE mg/dL
Spec Grav, UA: 1.02 (ref 1.010–1.025)
Urobilinogen, UA: 0.2 U/dL
pH, UA: 6 (ref 5.0–8.0)

## 2023-12-21 LAB — POCT URINE PREGNANCY: Preg Test, Ur: NEGATIVE

## 2023-12-21 MED ORDER — METRONIDAZOLE 500 MG PO TABS
500.0000 mg | ORAL_TABLET | Freq: Two times a day (BID) | ORAL | 0 refills | Status: DC
Start: 2023-12-21 — End: 2024-05-15

## 2023-12-21 NOTE — ED Provider Notes (Signed)
 [facility]    CSN: [id number] Arrival date & time: 12/21/23  1713      History   Chief Complaint Chief Complaint  Patient presents with   Vaginal Discharge    Entered by patient    HPI [name] is a 23 y.o. female presents for vaginal discharge.  Patient reports 2 days of a malodorous watery discharge.  Endorses some very mild itching but no dysuria, fevers, nausea/vomiting, flank pain.  No STD exposure or concern.  She was seen a week ago for vaginitis and did test positive for yeast and did resolve with Diflucan .  She has not used any OTC treatments for symptoms.  No other concerns at this time.   Vaginal Discharge   Past Medical History:  Diagnosis Date   Migraines     Patient Active Problem List   Diagnosis Date Noted   Generalized anxiety disorder 05/16/2023   Flexural eczema 05/16/2023   Migraine with aura and without status migrainosus 12/13/2017   Migraine without aura and without status migrainosus, not intractable 08/30/2017   Episodic tension-type headache, not intractable 08/30/2017   Morbid obesity (HCC) 08/30/2017   Acanthosis nigricans, acquired 08/30/2017   Appendicitis, acute, with generalized peritonitis 02/05/2014    Past Surgical History:  Procedure Laterality Date   APPENDECTOMY     LAPAROSCOPIC APPENDECTOMY N/A 02/04/2014   Procedure: APPENDECTOMY LAPAROSCOPIC;  Surgeon: CHRISTELLA. Julietta Millman, MD;  Location: [facility];  Service: Pediatrics;  Laterality: N/A;    OB History   No obstetric history on file.      Home Medications    Prior to Admission medications   Medication Sig Start Date End Date Taking? Authorizing Provider  metroNIDAZOLE  (FLAGYL ) 500 MG tablet Take 1 tablet (500 mg total) by mouth 2 (two) times daily. 12/21/23  Yes Jakirah Zaun, Jodi R, NP  amitriptyline  (ELAVIL ) 10 MG tablet Take 1 tablet (10 mg total) by mouth at bedtime. 10/24/23   Ozell Heron CHRISTELLA, MD  betamethasone  valerate ointment (VALISONE ) 0.1 % APPLY TO AFFECTED  AREA TWICE A DAY 10/20/23   Ozell Heron CHRISTELLA, MD  fluconazole  (DIFLUCAN ) 150 MG tablet Take 1 tablet (150 mg total) by mouth daily. Take 1 tablet today and may repeat in 3 days if symptoms persist 12/13/23   Loreda Myla SAUNDERS, NP  ibuprofen  (ADVIL ) 400 MG tablet Take 1 tablet (400 mg total) by mouth every 6 (six) hours as needed. 08/22/22   Palumbo, April, MD  Vitamin D , Ergocalciferol , (DRISDOL ) 1.25 MG (50000 UNIT) CAPS capsule Take 1 capsule (50,000 Units total) by mouth every 7 (seven) days. 12/02/23   Ozell Heron CHRISTELLA, MD    Family History Family History  Problem Relation Age of Onset   Heart disease Father    Heart disease Maternal Grandmother    Hypertension Maternal Grandmother     Social History Social History   Tobacco Use   Smoking status: Never    Passive exposure: Yes   Smokeless tobacco: Never  Vaping Use   Vaping status: Never Used  Substance Use Topics   Alcohol use: No   Drug use: Yes    Types: Marijuana     Allergies   Patient has no known allergies.   Review of Systems Review of Systems  Genitourinary:  Positive for vaginal discharge.     Physical Exam Triage Vital Signs ED Triage Vitals  Encounter Vitals Group     BP 12/21/23 1737 112/72     Girls Systolic BP Percentile --  Girls Diastolic BP Percentile --      Boys Systolic BP Percentile --      Boys Diastolic BP Percentile --      Pulse Rate 12/21/23 1737 75     Resp 12/21/23 1737 17     Temp 12/21/23 1737 98.5 F (36.9 C)     Temp Source 12/21/23 1737 Oral     SpO2 12/21/23 1737 98 %     Weight --      Height --      Head Circumference --      Peak Flow --      Pain Score 12/21/23 1736 0     Pain Loc --      Pain Education --      Exclude from Growth Chart --    No data found.  Updated Vital Signs BP 112/72 (BP Location: Right Arm)   Pulse 75   Temp 98.5 F (36.9 C) (Oral)   Resp 17   LMP 12/19/2023 (Exact Date)   SpO2 98%   Visual Acuity Right Eye Distance:   Left Eye  Distance:   Bilateral Distance:    Right Eye Near:   Left Eye Near:    Bilateral Near:     Physical Exam Vitals and nursing note reviewed.  Constitutional:      Appearance: Normal appearance.  HENT:     Head: Normocephalic and atraumatic.  Eyes:     Pupils: Pupils are equal, round, and reactive to light.  Cardiovascular:     Rate and Rhythm: Normal rate.  Pulmonary:     Effort: Pulmonary effort is normal.  Skin:    General: Skin is warm and dry.  Neurological:     General: No focal deficit present.     Mental Status: She is alert and oriented to person, place, and time.  Psychiatric:        Mood and Affect: Mood normal.        Behavior: Behavior normal.      UC Treatments / Results  Labs (all labs ordered are listed, but only abnormal results are displayed) Labs Reviewed  POCT URINALYSIS DIP (MANUAL ENTRY)  POCT URINE PREGNANCY  CERVICOVAGINAL ANCILLARY ONLY    EKG   Radiology No results found.  Procedures Procedures (including critical care time)  Medications Ordered in UC Medications - No data to display  Initial Impression / Assessment and Plan / UC Course  I have reviewed the triage vital signs and the nursing notes.  Pertinent labs & imaging results that were available during my care of the patient were reviewed by me and considered in my medical decision making (see chart for details).     Reviewed exam and symptoms with patient.  No red flags.  Vaginal swab was ordered and will contact for any positive results.  Urine negative.  Will start metronidazole  for BV, side effect profile reviewed.  Advised rest fluids and GYN or PCP follow-up if symptoms do not improve.  ER precautions reviewed. Final Clinical Impressions(s) / UC Diagnoses   Final diagnoses:  Vaginal discharge     Discharge Instructions      The clinic will contact you with results of the vaginal swab done today positive.  Start metronidazole  twice daily for 7 days.  No alcohol  while on this medication.  Follow-up with your PCP or gynecologist if symptoms do not improve.  Please go to the ER for any worsening symptoms.  Hope you feel better soon!  ED Prescriptions     Medication Sig Dispense Auth. Provider   metroNIDAZOLE  (FLAGYL ) 500 MG tablet Take 1 tablet (500 mg total) by mouth 2 (two) times daily. 14 tablet Vishaal Strollo, Jodi R, NP      PDMP not reviewed this encounter.   Loreda Myla SAUNDERS, NP 12/21/23 1800

## 2023-12-21 NOTE — ED Triage Notes (Signed)
 Pt present with c/o vaginal discharge and fishy odor. Pt states she also has vaginal itching. The symptoms started two days ago.

## 2023-12-21 NOTE — Discharge Instructions (Addendum)
 The clinic will contact you with results of the vaginal swab done today positive.  Start metronidazole  twice daily for 7 days.  No alcohol while on this medication.  Follow-up with your PCP or gynecologist if symptoms do not improve.  Please go to the ER for any worsening symptoms.  Hope you feel better soon!

## 2023-12-22 LAB — CERVICOVAGINAL ANCILLARY ONLY
Bacterial Vaginitis (gardnerella): POSITIVE — AB
Candida Glabrata: NEGATIVE
Candida Vaginitis: NEGATIVE
Comment: NEGATIVE
Comment: NEGATIVE
Comment: NEGATIVE

## 2023-12-23 ENCOUNTER — Ambulatory Visit (HOSPITAL_COMMUNITY): Payer: Self-pay

## 2024-01-02 ENCOUNTER — Ambulatory Visit: Admitting: Family Medicine

## 2024-01-06 ENCOUNTER — Ambulatory Visit: Admitting: Family Medicine

## 2024-01-09 ENCOUNTER — Telehealth: Payer: Self-pay | Admitting: Family Medicine

## 2024-01-09 NOTE — Telephone Encounter (Signed)
 Ok to dismiss

## 2024-01-09 NOTE — Telephone Encounter (Signed)
 Good day! As of 01/09/24 patient has missed 3 appointments, which qualifies for dismissal. Let us  know what you'd like to do.  Thanks

## 2024-01-21 ENCOUNTER — Ambulatory Visit: Payer: Self-pay

## 2024-01-31 ENCOUNTER — Ambulatory Visit
Admission: EM | Admit: 2024-01-31 | Discharge: 2024-01-31 | Disposition: A | Discharge status: Home / Self Care | Attending: Family Medicine | Admitting: Family Medicine

## 2024-01-31 DIAGNOSIS — G43809 Other migraine, not intractable, without status migrainosus: Secondary | ICD-10-CM

## 2024-01-31 MED ORDER — KETOROLAC TROMETHAMINE 30 MG/ML IJ SOLN
30.0000 mg | Freq: Once | INTRAMUSCULAR | Status: AC
  Administered 2024-01-31 (×2): 30 mg via INTRAMUSCULAR

## 2024-01-31 MED ORDER — DEXAMETHASONE SODIUM PHOSPHATE 10 MG/ML IJ SOLN
10.0000 mg | Freq: Once | INTRAMUSCULAR | Status: AC
Start: 2024-01-31 — End: 2024-01-31
  Administered 2024-01-31 (×2): 10 mg via INTRAMUSCULAR

## 2024-01-31 MED ORDER — SUMATRIPTAN SUCCINATE 25 MG PO TABS
ORAL_TABLET | ORAL | 0 refills | Status: DC
Start: 2024-01-31 — End: 2024-05-15

## 2024-01-31 MED ORDER — ONDANSETRON 4 MG PO TBDP
4.0000 mg | ORAL_TABLET | Freq: Once | ORAL | Status: AC
  Administered 2024-01-31 (×2): 4 mg via ORAL

## 2024-01-31 NOTE — ED Provider Notes (Signed)
 [facility]  Note:  This document was prepared using Conservation officer, historic buildings and may include unintentional dictation errors.  MRN: [id number] DOB: 2001/01/19  Subjective:   [name] is a 23 y.o. female presenting for acute onset of a migraine since this morning.  Headache is mid to right frontal temporal headache with eye pressure, photophobia, nausea without vomiting.  Reports the headache is severe but typical for her migraines.  Patient took 1 p.o. Toradol  but is unsure what the dose was.  This was 5-1/2 hours ago.  She has Nurtec that she could use at home but feels that it is ineffective.  She does see a migraine specialist but was unable to follow-up with them today.  No fever, sinus symptoms, neck pain, neck stiffness.  No current facility-administered medications for this encounter.  Current Outpatient Medications:    amitriptyline  (ELAVIL ) 10 MG tablet, Take 1 tablet (10 mg total) by mouth at bedtime., Disp: 90 tablet, Rfl: 1   betamethasone  valerate ointment (VALISONE ) 0.1 %, APPLY TO AFFECTED AREA TWICE A DAY, Disp: 30 g, Rfl: 0   fluconazole  (DIFLUCAN ) 150 MG tablet, Take 1 tablet (150 mg total) by mouth daily. Take 1 tablet today and may repeat in 3 days if symptoms persist, Disp: 2 tablet, Rfl: 0   ibuprofen  (ADVIL ) 400 MG tablet, Take 1 tablet (400 mg total) by mouth every 6 (six) hours as needed., Disp: 12 tablet, Rfl: 0   metroNIDAZOLE  (FLAGYL ) 500 MG tablet, Take 1 tablet (500 mg total) by mouth 2 (two) times daily., Disp: 14 tablet, Rfl: 0   Vitamin D , Ergocalciferol , (DRISDOL ) 1.25 MG (50000 UNIT) CAPS capsule, Take 1 capsule (50,000 Units total) by mouth every 7 (seven) days., Disp: 12 capsule, Rfl: 1   No Known Allergies  Past Medical History:  Diagnosis Date   Migraines      Past Surgical History:  Procedure Laterality Date   APPENDECTOMY     LAPAROSCOPIC APPENDECTOMY N/A 02/04/2014   Procedure: APPENDECTOMY LAPAROSCOPIC;   Surgeon: CHRISTELLA. Julietta Millman, MD;  Location: [facility] OR;  Service: Pediatrics;  Laterality: N/A;    Family History  Problem Relation Age of Onset   Heart disease Father    Heart disease Maternal Grandmother    Hypertension Maternal Grandmother     Social History   Tobacco Use   Smoking status: Never    Passive exposure: Yes   Smokeless tobacco: Never  Vaping Use   Vaping status: Never Used  Substance Use Topics   Alcohol use: No   Drug use: Yes    Types: Marijuana    ROS   Objective:   Vitals: BP (!) 146/83 (BP Location: Right Arm)   Pulse 70   Temp 98.9 F (37.2 C) (Oral)   Resp (!) 22 Comment: patient crying  LMP 01/10/2024   SpO2 98%   Physical Exam Constitutional:      General: She is in acute distress (tearful from her migraine pain).     Appearance: Normal appearance. She is well-developed and normal weight. She is not ill-appearing, toxic-appearing or diaphoretic.  HENT:     Head: Normocephalic and atraumatic.     Right Ear: Tympanic membrane, ear canal and external ear normal. No drainage or tenderness. No middle ear effusion. There is no impacted cerumen. Tympanic membrane is not erythematous or bulging.     Left Ear: Tympanic membrane, ear canal and external ear normal. No drainage or tenderness.  No middle ear effusion. There  is no impacted cerumen. Tympanic membrane is not erythematous or bulging.     Nose: Nose normal. No congestion or rhinorrhea.     Mouth/Throat:     Mouth: Mucous membranes are moist. No oral lesions.     Pharynx: No pharyngeal swelling, oropharyngeal exudate, posterior oropharyngeal erythema or uvula swelling.     Tonsils: No tonsillar exudate or tonsillar abscesses.  Eyes:     General: No scleral icterus.       Right eye: No discharge.        Left eye: No discharge.     Extraocular Movements: Extraocular movements intact.     Right eye: Normal extraocular motion.     Left eye: Normal extraocular motion.     Conjunctiva/sclera:  Conjunctivae normal.     Pupils: Pupils are equal, round, and reactive to light.     Comments: Photophobia noted.  Neck:     Meningeal: Brudzinski's sign and Kernig's sign absent.  Cardiovascular:     Rate and Rhythm: Normal rate.  Pulmonary:     Effort: Pulmonary effort is normal.  Musculoskeletal:     Cervical back: Normal range of motion and neck supple.  Lymphadenopathy:     Cervical: No cervical adenopathy.  Skin:    General: Skin is warm and dry.  Neurological:     General: No focal deficit present.     Mental Status: She is alert and oriented to person, place, and time.     Cranial Nerves: No cranial nerve deficit, dysarthria or facial asymmetry.     Motor: No weakness or pronator drift.     Coordination: Romberg sign negative. Coordination normal. Finger-Nose-Finger Test and Heel to [facility] Test normal. Rapid alternating movements normal.     Gait: Gait and tandem walk normal.     Deep Tendon Reflexes: Reflexes normal.  Psychiatric:        Mood and Affect: Mood normal.        Behavior: Behavior normal.        Thought Content: Thought content normal.        Judgment: Judgment normal.    IM Toradol  30 mg, IM dexamethasone  10 mg, p.o. Zofran  4 mg ODT administered in clinic.  Assessment and Plan :   PDMP not reviewed this encounter.  1. Other migraine without status migrainosus, not intractable    Patient has a typical migraine.  The above interventions were done.  Offered her sumatriptan  which she could use at home.  Maintain strict ER precautions today should her headache persist.  Counseled patient on potential for adverse effects with medications prescribed today, patient verbalized understanding.    Christopher Savannah, [place] 01/31/24 1507

## 2024-01-31 NOTE — ED Triage Notes (Signed)
 Pt c/o migraine started ~9am-states she has nurtex but did not take because it does not relieve pain-she did take toradol  po ~930am with no relief-NAD-tearful

## 2024-02-10 ENCOUNTER — Other Ambulatory Visit: Payer: Self-pay | Admitting: Family Medicine

## 2024-02-10 DIAGNOSIS — L2082 Flexural eczema: Secondary | ICD-10-CM

## 2024-04-06 ENCOUNTER — Ambulatory Visit
Admission: EM | Admit: 2024-04-06 | Discharge: 2024-04-06 | Disposition: A | Discharge status: Home / Self Care | Payer: Self-pay | Attending: Family Medicine | Admitting: Family Medicine

## 2024-04-06 ENCOUNTER — Ambulatory Visit: Payer: Self-pay

## 2024-04-06 DIAGNOSIS — R519 Headache, unspecified: Secondary | ICD-10-CM | POA: Diagnosis not present

## 2024-04-06 MED ORDER — ACETAMINOPHEN 325 MG PO TABS
650.0000 mg | ORAL_TABLET | Freq: Once | ORAL | Status: AC
  Administered 2024-04-06: 650 mg via ORAL
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
## 2024-04-06 NOTE — Discharge Instructions (Addendum)
 You are given Tylenol  in the clinic for your headache.  You may continue over-the-counter Tylenol  or ibuprofen  as needed for your headache.  Continue to stay hydrated.  Follow-up with your PCP if your headaches do not continue to improve.  Please go to the ER if you develop any worsening symptoms.  I hope you feel better soon!
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
## 2024-04-08 ENCOUNTER — Other Ambulatory Visit: Payer: Self-pay | Admitting: Family Medicine

## 2024-04-08 DIAGNOSIS — L2082 Flexural eczema: Secondary | ICD-10-CM

## 2024-04-13 ENCOUNTER — Ambulatory Visit: Admitting: Family Medicine

## 2024-04-17 ENCOUNTER — Telehealth: Payer: Self-pay | Admitting: Family Medicine

## 2024-04-17 NOTE — Telephone Encounter (Signed)
 Patient has no showed/late cancelled 4 appointments since 07/11/23 which qualifies for dismissal. Please advise, thanks!

## 2024-04-19 NOTE — Telephone Encounter (Signed)
 Ok to dismiss

## 2024-04-23 ENCOUNTER — Encounter: Payer: Self-pay | Admitting: Family Medicine

## 2024-05-15 ENCOUNTER — Other Ambulatory Visit (HOSPITAL_COMMUNITY)
Admission: RE | Admit: 2024-05-15 | Discharge: 2024-05-15 | Disposition: A | Discharge status: Home / Self Care | Source: Ambulatory Visit | Attending: Family Medicine | Admitting: Family Medicine

## 2024-05-15 ENCOUNTER — Ambulatory Visit: Admitting: Family Medicine

## 2024-05-15 ENCOUNTER — Encounter: Payer: Self-pay | Admitting: Family Medicine

## 2024-05-15 VITALS — BP 122/60 | HR 82 | Temp 98.4°F | Ht 64.0 in | Wt 234.8 lb

## 2024-05-15 DIAGNOSIS — B731 Onchocerciasis without eye disease: Secondary | ICD-10-CM | POA: Diagnosis present

## 2024-05-15 DIAGNOSIS — N898 Other specified noninflammatory disorders of vagina: Secondary | ICD-10-CM | POA: Diagnosis not present

## 2024-05-15 DIAGNOSIS — B3731 Acute candidiasis of vulva and vagina: Secondary | ICD-10-CM | POA: Diagnosis not present

## 2024-05-15 MED ORDER — FLUCONAZOLE 150 MG PO TABS: 150.0000 mg | ORAL_TABLET | ORAL | 0 refills | Status: DC | PRN

## 2024-05-15 NOTE — Progress Notes (Signed)
 c  Established Patient Office Visit  Subjective   Patient ID: [name], female    DOB: May 21, 2001  Age: 23 y.o. MRN: [id number]  Chief Complaint  Patient presents with   Vaginal Itching    X4 days, initially noticed pus and yellow drainage    Vaginal Itching   Discussed the use of AI scribe software for clinical note transcription with the patient, who gave verbal consent to proceed.  History of Present Illness   [name] is a 23 year old female who presents with vaginal irritation and discharge.  She reports vaginal irritation and discharge starting Friday after intercourse with condom use on Thursday. Irritation is mainly external with tingling, itching, and some skin peeling. She suspects a possible condom allergy, although she has used condoms in the past without problems. She has had yeast infection and bacterial vaginosis in June and July but states current symptoms feel different. She notes yellowish drainage without significant odor. She avoids A and D ointment because it has previously triggered yeast infections.       Current Outpatient Medications  Medication Instructions   fluconazole  (DIFLUCAN ) 150 mg, Oral, Every 3 DAYS PRN    Patient Active Problem List   Diagnosis Date Noted   Generalized anxiety disorder 05/16/2023   Flexural eczema 05/16/2023   Migraine with aura and without status migrainosus 12/13/2017   Migraine without aura and without status migrainosus, not intractable 08/30/2017   Episodic tension-type headache, not intractable 08/30/2017   Morbid obesity (HCC) 08/30/2017   Acanthosis nigricans, acquired 08/30/2017   Appendicitis, acute, with generalized peritonitis 02/05/2014     Review of Systems  All other systems reviewed and are negative.     Objective:     BP 122/60   Pulse 82   Temp 98.4 F (36.9 C) (Oral)   Ht 5' 4 (1.626 m)   Wt 234 lb 12.8 oz (106.5 kg)   LMP 04/29/2024 (Exact Date)   SpO2 97%   BMI 40.30 kg/m     Physical Exam Constitutional:      Appearance: Normal appearance. She is obese.  Genitourinary:    Exam position: Lithotomy position.     Pubic Area: No rash.      Labia:        Right: No rash.        Left: No rash.      Vagina: Erythema (erythema of the vaginal wall tissue, no lacerations or other lesions, there is white milky discharge) present.  Neurological:     Mental Status: She is alert.      No results found for any visits on 05/15/24.    The ASCVD Risk score (Arnett DK, et al., 2019) failed to calculate for the following reasons:   The 2019 ASCVD risk score is only valid for ages 27 to 28    Assessment & Plan:  Candida vaginitis -     Fluconazole ; Take 1 tablet (150 mg total) by mouth every three (3) days as needed.  Dispense: 2 tablet; Refill: 0 -     Cervicovaginal ancillary only  Vaginal discharge -     Cervicovaginal ancillary only   Assessment and Plan    Acute vulvovaginal candidiasis Recurrent vulvovaginal candidiasis with symptoms of tingling, itching, and milky white discharge. Examination reveals inflamed tissue at the vaginal opening without tears or lacerations. Differential diagnosis includes allergic reaction, but less likely. Previous yeast infection and bacterial vaginosis resolved. Current symptoms suggestive of yeast infection. - Performed swab for  further analysis - Prescribed Diflucan  (fluconazole ) with instructions to take one tablet today and another in three days - Sent prescription to [facility] on [address]        No follow-ups on file.    Heron CHRISTELLA Sharper, MD

## 2024-05-16 LAB — CERVICOVAGINAL ANCILLARY ONLY
Bacterial Vaginitis (gardnerella): POSITIVE — AB
Candida Glabrata: POSITIVE — AB
Candida Vaginitis: POSITIVE — AB
Chlamydia: NEGATIVE
Comment: NEGATIVE
Comment: NEGATIVE
Comment: NEGATIVE
Comment: NEGATIVE
Comment: NEGATIVE
Comment: NORMAL
Neisseria Gonorrhea: NEGATIVE
Trichomonas: NEGATIVE

## 2024-05-21 ENCOUNTER — Encounter: Payer: Self-pay | Admitting: Family Medicine

## 2024-05-21 DIAGNOSIS — B379 Candidiasis, unspecified: Secondary | ICD-10-CM

## 2024-05-21 DIAGNOSIS — B9689 Other specified bacterial agents as the cause of diseases classified elsewhere: Secondary | ICD-10-CM

## 2024-05-21 MED ORDER — METRONIDAZOLE 500 MG PO TABS: 500.0000 mg | ORAL_TABLET | Freq: Two times a day (BID) | ORAL | 0 refills | Status: AC

## 2024-05-21 MED ORDER — BORIC ACID 600 MG VA SUPP: 1.0000 | Freq: Every day | VAGINAL | 0 refills | Status: DC

## 2024-05-30 ENCOUNTER — Other Ambulatory Visit: Payer: Self-pay | Admitting: Family Medicine

## 2024-05-30 DIAGNOSIS — L2082 Flexural eczema: Secondary | ICD-10-CM

## 2024-06-01 ENCOUNTER — Other Ambulatory Visit: Payer: Self-pay | Admitting: Family Medicine

## 2024-06-01 DIAGNOSIS — L2082 Flexural eczema: Secondary | ICD-10-CM

## 2024-07-13 ENCOUNTER — Inpatient Hospital Stay (HOSPITAL_COMMUNITY)

## 2024-07-13 ENCOUNTER — Inpatient Hospital Stay (HOSPITAL_COMMUNITY)
Admission: AD | Admit: 2024-07-13 | Discharge: 2024-07-13 | Disposition: A | Discharge status: Home / Self Care | Attending: Obstetrics & Gynecology | Admitting: Obstetrics & Gynecology

## 2024-07-13 ENCOUNTER — Encounter (HOSPITAL_COMMUNITY): Payer: Self-pay | Admitting: Obstetrics & Gynecology

## 2024-07-13 DIAGNOSIS — Z3491 Encounter for supervision of normal pregnancy, unspecified, first trimester: Secondary | ICD-10-CM

## 2024-07-13 DIAGNOSIS — Z3A01 Less than 8 weeks gestation of pregnancy: Secondary | ICD-10-CM | POA: Diagnosis not present

## 2024-07-13 DIAGNOSIS — O209 Hemorrhage in early pregnancy, unspecified: Secondary | ICD-10-CM | POA: Diagnosis present

## 2024-07-13 DIAGNOSIS — Z3201 Encounter for pregnancy test, result positive: Secondary | ICD-10-CM | POA: Diagnosis not present

## 2024-07-13 DIAGNOSIS — R1030 Lower abdominal pain, unspecified: Secondary | ICD-10-CM | POA: Diagnosis present

## 2024-07-13 DIAGNOSIS — Z113 Encounter for screening for infections with a predominantly sexual mode of transmission: Secondary | ICD-10-CM | POA: Diagnosis present

## 2024-07-13 LAB — URINALYSIS, ROUTINE W REFLEX MICROSCOPIC
Bacteria, UA: NONE SEEN
Bilirubin Urine: NEGATIVE
Glucose, UA: NEGATIVE mg/dL
Ketones, ur: NEGATIVE mg/dL
Leukocytes,Ua: NEGATIVE
Nitrite: NEGATIVE
Protein, ur: NEGATIVE mg/dL
Specific Gravity, Urine: 1.021 (ref 1.005–1.030)
pH: 5 (ref 5.0–8.0)

## 2024-07-13 LAB — CBC
HCT: 39.7 % (ref 36.0–46.0)
Hemoglobin: 13.4 g/dL (ref 12.0–15.0)
MCH: 27.1 pg (ref 26.0–34.0)
MCHC: 33.8 g/dL (ref 30.0–36.0)
MCV: 80.2 fL (ref 80.0–100.0)
Platelets: 174 K/uL (ref 150–400)
RBC: 4.95 MIL/uL (ref 3.87–5.11)
RDW: 13.1 % (ref 11.5–15.5)
WBC: 6.9 K/uL (ref 4.0–10.5)
nRBC: 0 % (ref 0.0–0.2)

## 2024-07-13 LAB — WET PREP, GENITAL
Clue Cells Wet Prep HPF POC: NONE SEEN
Sperm: NONE SEEN
Trich, Wet Prep: NONE SEEN
WBC, Wet Prep HPF POC: <10
Yeast Wet Prep HPF POC: NONE SEEN

## 2024-07-13 LAB — HCG, QUANTITATIVE, PREGNANCY: hCG, Beta Chain, Quant, S: 13999 m[IU]/mL — ABNORMAL HIGH

## 2024-07-13 LAB — ABO/RH: ABO/RH(D): O POS

## 2024-07-13 LAB — POCT PREGNANCY, URINE: Preg Test, Ur: POSITIVE — AB

## 2024-07-13 NOTE — Discharge Instructions (Signed)
  [facility] [employer] [facility] [employer] at [facility]  [address], [place], [place] [address]  [phone]  Center for [facility] at [facility]  [address], [place], [place] [address]  [phone]  Center for [facility] at [facility] [address], [place], [place] [address]  [phone]  Center for [facility] at [facility] [address], [place], [place] [address] [phone]  Center for [facility] at [facility]  [address] [place], [place] [address]  [phone]  Center for [facility] at [facility]  [address] (First floor), [place], [place] [address]  [phone]  Center for [facility] at [facility]  [address] [place], [place], [place] [address]  [phone]  [facility]  [address], [place], [place] [address]  [phone]  [facility]  [address] [place], [place], [place] [address]  [phone]  [facility]  [address] [place], [place] [address]  [phone]  [facility]  [address], [place], [place] [address]  [phone]  [facility]   [address] [place], [place] [address]  [phone]  [facility]  [address] [place], [place] [address]   [phone]  [facility] [address], [place], [place] [address] [phone]  [facility]  [address], [place], [place] [address]  [phone]

## 2024-07-13 NOTE — MAU Note (Signed)
 Last Wed - went to [facility] in [place]- postive UPT.  They did U/S - said she was 6.1 weeks .     Pt says she had light dark brown bleeding- when she wiped- started Wed  Thurs- turned reddish - on pad . Today- red blood on pad.  In Triage - nothing .  Feels mild cramping- started today - 2/10- no meds for pain

## 2024-07-13 NOTE — MAU Provider Note (Signed)
 " History     CSN: [id number]  Arrival date and time: 07/13/24 1803   Event Date/Time   First Provider Initiated Contact with Patient 07/13/24 1956      Chief Complaint  Patient presents with   Abdominal Pain   Vaginal Bleeding   HPI [name] is a 24 y.o. G1P0 at [redacted]w[redacted]d who presents for vaginal bleeding. Symptoms started Wednesday. Reports brown spotting that is more red now. Not filling up a pad or passing clots. Reports mild lower abdominal cramping since yesterday.  Denies dysuria. Reports vaginal discharge & irritation. Last intercourse Tuesday. Doesn't have ob/gyn.   OB History     Gravida  1   Para      Term      Preterm      AB      Living         SAB      IAB      Ectopic      Multiple      Live Births              Past Medical History:  Diagnosis Date   Migraines     Past Surgical History:  Procedure Laterality Date   APPENDECTOMY     LAPAROSCOPIC APPENDECTOMY N/A 02/04/2014   Procedure: APPENDECTOMY LAPAROSCOPIC;  Surgeon: CHRISTELLA. Julietta Millman, MD;  Location: [facility] OR;  Service: Pediatrics;  Laterality: N/A;    Family History  Problem Relation Age of Onset   Heart disease Father    Heart disease Maternal Grandmother    Hypertension Maternal Grandmother     Social History[1]  Allergies: Allergies[2]  Medications Prior to Admission  Medication Sig Dispense Refill Last Dose/Taking   Boric Acid 600 MG SUPP Place 1 suppository vaginally at bedtime. 7 suppository 0 More than a month   fluconazole  (DIFLUCAN ) 150 MG tablet Take 1 tablet (150 mg total) by mouth every three (3) days as needed. 2 tablet 0 More than a month    Review of Systems  All other systems reviewed and are negative.  Physical Exam   Blood pressure 125/64, pulse 64, temperature 98.5 F (36.9 C), temperature source Oral, resp. rate 12, height 5' 3 (1.6 m), weight 103.9 kg, last menstrual period 05/25/2024.  Physical Exam Vitals and nursing note reviewed.   Constitutional:      General: She is not in acute distress.    Appearance: She is well-developed. She is not ill-appearing.  HENT:     Head: Normocephalic and atraumatic.  Eyes:     General: No scleral icterus.       Right eye: No discharge.        Left eye: No discharge.     Conjunctiva/sclera: Conjunctivae normal.  Pulmonary:     Effort: Pulmonary effort is normal. No respiratory distress.  Neurological:     General: No focal deficit present.     Mental Status: She is alert.  Psychiatric:        Mood and Affect: Mood normal.        Behavior: Behavior normal.     MAU Course  Procedures Results for orders placed or performed during the hospital encounter of 07/13/24 (from the past 24 hours)  Urinalysis, Routine w reflex microscopic -Urine, Clean Catch     Status: Abnormal   Collection Time: 07/13/24  6:22 PM  Result Value Ref Range   Color, Urine YELLOW YELLOW   APPearance HAZY (A) CLEAR   Specific Gravity, Urine 1.021 1.005 -  1.030   pH 5.0 5.0 - 8.0   Glucose, UA NEGATIVE NEGATIVE mg/dL   Hgb urine dipstick MODERATE (A) NEGATIVE   Bilirubin Urine NEGATIVE NEGATIVE   Ketones, ur NEGATIVE NEGATIVE mg/dL   Protein, ur NEGATIVE NEGATIVE mg/dL   Nitrite NEGATIVE NEGATIVE   Leukocytes,Ua NEGATIVE NEGATIVE   RBC / HPF 0-5 0 - 5 RBC/hpf   WBC, UA 0-5 0 - 5 WBC/hpf   Bacteria, UA NONE SEEN NONE SEEN   Squamous Epithelial / HPF 0-5 0 - 5 /HPF   Mucus PRESENT   Wet prep, genital     Status: None   Collection Time: 07/13/24  6:24 PM   Specimen: PATH Cytology Cervicovaginal Ancillary Only  Result Value Ref Range   Yeast Wet Prep HPF POC NONE SEEN NONE SEEN   Trich, Wet Prep NONE SEEN NONE SEEN   Clue Cells Wet Prep HPF POC NONE SEEN NONE SEEN   WBC, Wet Prep HPF POC <10 <10   Sperm NONE SEEN   Pregnancy, urine POC     Status: Abnormal   Collection Time: 07/13/24  6:34 PM  Result Value Ref Range   Preg Test, Ur POSITIVE (A) NEGATIVE  CBC     Status: None   Collection  Time: 07/13/24  6:37 PM  Result Value Ref Range   WBC 6.9 4.0 - 10.5 K/uL   RBC 4.95 3.87 - 5.11 MIL/uL   Hemoglobin 13.4 12.0 - 15.0 g/dL   HCT 60.2 63.9 - 53.9 %   MCV 80.2 80.0 - 100.0 fL   MCH 27.1 26.0 - 34.0 pg   MCHC 33.8 30.0 - 36.0 g/dL   RDW 86.8 88.4 - 84.4 %   Platelets 174 150 - 400 K/uL   nRBC 0.0 0.0 - 0.2 %  ABO/Rh     Status: None   Collection Time: 07/13/24  6:37 PM  Result Value Ref Range   ABO/RH(D) O POS    No rh immune globuloin      NOT A RH IMMUNE GLOBULIN CANDIDATE, PT RH POSITIVE Performed at [facility], [address], [place], [place] [address]    US  OB LESS THAN 14 WEEKS WITH OB TRANSVAGINAL Result Date: 07/13/2024 EXAM: ULTRASOUND FIRST TRIMESTER TECHNIQUE: Transabdominal and Transvaginal first trimester obstetric pelvic duplex ultrasound was performed with real-time imaging and color flow Doppler imaging. COMPARISON: None available. CLINICAL HISTORY: Positive pregnancy test with vaginal bleeding. FINDINGS: UTERUS: No focal myometrial mass. GESTATIONAL SAC(S): Single normal appearing gestational sac. No subchorionic hemorrhage. YOLK SAC: Present EMBRYO(<11WK) /FETUS(>=11WK): Single CROWN RUMP LENGTH: 4.3 mm RATE OF CARDIAC ACTIVITY: 111 bpm. RIGHT OVARY: Corpus luteum cyst is noted within the right ovary. The ovaries are otherwise within normal limits. Normal arterial and venous flow. LEFT OVARY: Unremarkable. Normal arterial and venous flow. FREE FLUID: No free fluid. MEASUREMENTS ESTIMATED GESTATIONAL AGE BY CURRENT ULTRASOUND: 6 weeks 1 day. ESTIMATED GESTATIONAL AGE BY LMP/PRIOR ULTRASOUND: Not provided in transcript. ESTIMATED DUE DATE: 03/07/2025. IMPRESSION: 1. Intrauterine pregnancy with embryonic cardiac activity, gestational age [redacted] weeks 1 day (estimated date of confinement 03/07/2025). Electronically signed by: Oneil Devonshire MD 07/13/2024 07:37 PM EST RP Workstation: [id number]    MDM   Assessment and Plan   1. Normal IUP (intrauterine  pregnancy) on prenatal ultrasound, first trimester   2. Vaginal bleeding in pregnancy, first trimester   3. [redacted] weeks gestation of pregnancy    -RH positive -Ultrasound shows live IUP measuring [redacted]w[redacted]d, EDD updated -Wet prep negative, GC/CT pending -  Reviewed bleeding precautions  Rocky Satterfield 07/13/2024, 7:56 PM      [1]  Social History Tobacco Use   Smoking status: Never    Passive exposure: Yes   Smokeless tobacco: Never  Vaping Use   Vaping status: Never Used  Substance Use Topics   Alcohol use: No   Drug use: Yes    Types: Marijuana  [2] No Known Allergies  "

## 2024-07-16 LAB — GC/CHLAMYDIA PROBE AMP (~~LOC~~) NOT AT ARMC
Chlamydia: NEGATIVE
Comment: NEGATIVE
Comment: NORMAL
Neisseria Gonorrhea: NEGATIVE

## 2024-07-23 ENCOUNTER — Other Ambulatory Visit: Payer: Self-pay

## 2024-07-23 ENCOUNTER — Inpatient Hospital Stay (HOSPITAL_COMMUNITY)

## 2024-07-23 ENCOUNTER — Inpatient Hospital Stay (HOSPITAL_COMMUNITY)
Admission: AD | Admit: 2024-07-23 | Discharge: 2024-07-23 | Disposition: A | Discharge status: Home / Self Care | Payer: Self-pay | Attending: Obstetrics and Gynecology | Admitting: Obstetrics and Gynecology

## 2024-07-23 ENCOUNTER — Encounter (HOSPITAL_COMMUNITY): Payer: Self-pay | Admitting: Obstetrics and Gynecology

## 2024-07-23 DIAGNOSIS — O034 Incomplete spontaneous abortion without complication: Secondary | ICD-10-CM | POA: Diagnosis not present

## 2024-07-23 DIAGNOSIS — O021 Missed abortion: Secondary | ICD-10-CM | POA: Insufficient documentation

## 2024-07-23 DIAGNOSIS — Z3A01 Less than 8 weeks gestation of pregnancy: Secondary | ICD-10-CM | POA: Diagnosis not present

## 2024-07-23 LAB — URINALYSIS, ROUTINE W REFLEX MICROSCOPIC
Bilirubin Urine: NEGATIVE
Glucose, UA: NEGATIVE mg/dL
Ketones, ur: 5 mg/dL — AB
Nitrite: NEGATIVE
Protein, ur: 30 mg/dL — AB
RBC / HPF: >50 RBC/hpf (ref 0–5)
Specific Gravity, Urine: 1.02 (ref 1.005–1.030)
pH: 5 (ref 5.0–8.0)

## 2024-07-23 LAB — WET PREP, GENITAL
Sperm: NONE SEEN
Trich, Wet Prep: NONE SEEN
WBC, Wet Prep HPF POC: >=10 — AB
Yeast Wet Prep HPF POC: NONE SEEN

## 2024-07-23 LAB — CBC
HCT: 40.4 % (ref 36.0–46.0)
Hemoglobin: 13.8 g/dL (ref 12.0–15.0)
MCH: 27.7 pg (ref 26.0–34.0)
MCHC: 34.2 g/dL (ref 30.0–36.0)
MCV: 81.1 fL (ref 80.0–100.0)
Platelets: 184 10*3/uL (ref 150–400)
RBC: 4.98 MIL/uL (ref 3.87–5.11)
RDW: 13.1 % (ref 11.5–15.5)
WBC: 8.5 10*3/uL (ref 4.0–10.5)
nRBC: 0 % (ref 0.0–0.2)

## 2024-07-23 LAB — HCG, QUANTITATIVE, PREGNANCY: hCG, Beta Chain, Quant, S: 19137 m[IU]/mL — ABNORMAL HIGH

## 2024-07-23 MED ORDER — ACETAMINOPHEN 500 MG PO TABS
1000.0000 mg | ORAL_TABLET | Freq: Once | ORAL | Status: AC
  Administered 2024-07-23: 1000 mg via ORAL
  Filled 2024-07-23: qty 2

## 2024-07-23 MED ORDER — ACETAMINOPHEN 500 MG PO TABS: 1000.0000 mg | ORAL_TABLET | Freq: Four times a day (QID) | ORAL | Status: AC | PRN

## 2024-07-23 MED ORDER — ONDANSETRON 4 MG PO TBDP: 4.0000 mg | ORAL_TABLET | Freq: Four times a day (QID) | ORAL | 0 refills | Status: AC | PRN

## 2024-07-23 MED ORDER — IBUPROFEN 800 MG PO TABS: 800.0000 mg | ORAL_TABLET | Freq: Three times a day (TID) | ORAL | 3 refills | Status: AC | PRN

## 2024-07-23 NOTE — MAU Provider Note (Cosign Needed Addendum)
 Chief Complaint:  Abdominal Pain and Vaginal Bleeding   HPI   None     [name] is a 24 y.o. G1P0 at [redacted]w[redacted]d who presents to maternity admissions reporting worsening abdominal cramps and vaginal bleeding. She reports that since her last visit to MAU 10 days ago on 07/13/2024, vaginal bleeding has become heavier. She reports changing a pad twice today and has passed 2-3 blots. Abdominal cramps currently 7/10 intensity. IUP previously confirmed.   Past Medical History:  Diagnosis Date   Appendicitis, acute, with generalized peritonitis 02/05/2014   Migraines    OB History  Gravida Para Term Preterm AB Living  1       SAB IAB Ectopic Multiple Live Births          # Outcome Date GA Lbr Len/2nd Weight Sex Type Anes PTL Lv  1 Current            Past Surgical History:  Procedure Laterality Date   APPENDECTOMY     LAPAROSCOPIC APPENDECTOMY N/A 02/04/2014   Procedure: APPENDECTOMY LAPAROSCOPIC;  Surgeon: CHRISTELLA. Julietta Millman, MD;  Location: [facility] OR;  Service: Pediatrics;  Laterality: N/A;   Family History  Problem Relation Age of Onset   Heart disease Father    Heart disease Maternal Grandmother    Hypertension Maternal Grandmother    Social History[1] Allergies[2] No medications prior to admission.    I have reviewed patient's Past Medical Hx, Surgical Hx, Family Hx, Social Hx, medications and allergies.   ROS  Pertinent items noted in HPI and remainder of comprehensive ROS otherwise negative.   PHYSICAL EXAM  Patient Vitals for the past 24 hrs:  BP Temp Temp src Pulse Resp SpO2 Height Weight  07/23/24 2217 -- -- -- -- 12 -- -- --  07/23/24 1828 (!) 146/66 98.4 F (36.9 C) Oral 83 18 100 % -- --  07/23/24 1823 -- -- -- -- -- -- 5' 4 (1.626 m) 103.9 kg    Constitutional: Well-developed, well-nourished female in no acute distress.  HEENT: atraumatic, normocephalic. Neck has normal ROM. EOM intact. Cardiovascular: normal rate & rhythm, warm and well-perfused Respiratory:  normal effort, no problems with respiration noted GI: Abd soft, non-tender, non-distended MSK: Extremities nontender, no edema, normal ROM Skin: warm and dry. Acyanotic, no jaundice or pallor. Neurologic: Alert and oriented x 4. No abnormal coordination. Psychiatric: Normal mood. Speech not slurred, not rapid/pressured. Patient is cooperative.  Labs: Results for orders placed or performed during the hospital encounter of 07/23/24 (from the past 24 hours)  Urinalysis, Routine w reflex microscopic -Urine, Clean Catch     Status: Abnormal   Collection Time: 07/23/24  6:50 PM  Result Value Ref Range   Color, Urine YELLOW YELLOW   APPearance HAZY (A) CLEAR   Specific Gravity, Urine 1.020 1.005 - 1.030   pH 5.0 5.0 - 8.0   Glucose, UA NEGATIVE NEGATIVE mg/dL   Hgb urine dipstick LARGE (A) NEGATIVE   Bilirubin Urine NEGATIVE NEGATIVE   Ketones, ur 5 (A) NEGATIVE mg/dL   Protein, ur 30 (A) NEGATIVE mg/dL   Nitrite NEGATIVE NEGATIVE   Leukocytes,Ua MODERATE (A) NEGATIVE   RBC / HPF >50 0 - 5 RBC/hpf   WBC, UA 6-10 0 - 5 WBC/hpf   Bacteria, UA RARE (A) NONE SEEN   Squamous Epithelial / HPF 6-10 0 - 5 /HPF   Mucus PRESENT   Wet prep, genital     Status: Abnormal   Collection Time: 07/23/24  8:39 PM  Result  Value Ref Range   Yeast Wet Prep HPF POC NONE SEEN NONE SEEN   Trich, Wet Prep NONE SEEN NONE SEEN   Clue Cells Wet Prep HPF POC PRESENT (A) NONE SEEN   WBC, Wet Prep HPF POC >=10 (A) <10   Sperm NONE SEEN   CBC     Status: None   Collection Time: 07/23/24  8:48 PM  Result Value Ref Range   WBC 8.5 4.0 - 10.5 K/uL   RBC 4.98 3.87 - 5.11 MIL/uL   Hemoglobin 13.8 12.0 - 15.0 g/dL   HCT 59.5 63.9 - 53.9 %   MCV 81.1 80.0 - 100.0 fL   MCH 27.7 26.0 - 34.0 pg   MCHC 34.2 30.0 - 36.0 g/dL   RDW 86.8 88.4 - 84.4 %   Platelets 184 150 - 400 K/uL   nRBC 0.0 0.0 - 0.2 %  hCG, quantitative, pregnancy     Status: Abnormal   Collection Time: 07/23/24  8:48 PM  Result Value Ref Range    hCG, Beta Chain, Quant, S 19,137 (H) <5 mIU/mL    Imaging:  US  OB Transvaginal Result Date: 07/23/2024 CLINICAL DATA:  Worsening cramping and vaginal bleeding EXAM: TRANSVAGINAL OB ULTRASOUND TECHNIQUE: Transvaginal ultrasound was performed for complete evaluation of the gestation as well as the maternal uterus, adnexal regions, and pelvic cul-de-sac. COMPARISON:  07/13/2024 FINDINGS: Intrauterine gestational sac: Single Yolk sac:  Not Visualized. Embryo:  Visualized. Cardiac Activity: Not Visualized. CRL:   9.3 mm   7 w 0 d                  US  EDC: 03/11/2025 Subchorionic hemorrhage:  None visualized. Maternal uterus/adnexae: No adnexal masses or free fluid. IMPRESSION: 1. Fetal demise, with no fetal cardiac activity identified on today's exam. Findings meet definitive criteria for failed pregnancy. This follows SRU consensus guidelines: Diagnostic Criteria for Nonviable Pregnancy Early in the First Trimester. [facility] [phone]. Electronically Signed   By: Ozell Daring M.D.   On: 07/23/2024 21:26    MDM & MAU COURSE  MDM: High  MAU Course: -Mildly elevated BP, other vitals within normal limits. -US  for possible SAB. -CBC for blood loss anemia. hCG to check for decrease which would indicate likely SAB. -UA, GC/CT, and wet prep to rule out infection.   -Tylenol  for cramping while awaiting results. -UA with moderate leukocytes and rare bacteria on microscopy, will send for culture. -Wet prep positive for BV, likely due to vaginal bleeding. -Pain completely resolved after Tylenol . -US  shows IUP with no cardiac activity, meeting definitive criteria for failed pregnancy. -Reassured patient that miscarriage is common with ~1/4 of women experiencing it in their lifetime. Reassured patient that there is nothing she did or did not do to cause this. Reviewed most common reason is presumed to be genetic abnormalities that allow a pregnancy to start but not continue past an early stage, but  realistically we do not know the cause in most cases. Reviewed that studies show no definite difference between attempting another pregnancy sooner vs waiting, though some studies do show better live birth outcomes with trying sooner. Reviewed options of expectant, medical, or surgical management. Patient would like to start with expectant management. -Evaluation does not show pathology that would require ongoing emergent intervention or inpatient treatment. Patient is hemodynamically stable and mentating appropriately. Discussed findings and plan with patient, who agrees with care plan.   Differential diagnosis considered for 1st trimester vaginal bleeding includes but is  not limited to: complete spontaneous abortion, incomplete abortion, missed abortion, threatened abortion, cervical insufficiency, cervical or vaginal disorder   Orders Placed This Encounter  Procedures   Wet prep, genital   Culture, OB Urine   US  OB Transvaginal   Urinalysis, Routine w reflex microscopic -Urine, Clean Catch   CBC   hCG, quantitative, pregnancy   Diet NPO time specified   Discharge patient   Meds ordered this encounter  Medications   acetaminophen  (TYLENOL ) tablet 1,000 mg   ibuprofen  (ADVIL ) 800 MG tablet    Sig: Take 1 tablet (800 mg total) by mouth 3 (three) times daily with meals as needed for headache or moderate pain (pain score 4-6).    Dispense:  30 tablet    Refill:  3   ondansetron  (ZOFRAN -ODT) 4 MG disintegrating tablet    Sig: Take 1 tablet (4 mg total) by mouth every 6 (six) hours as needed for nausea.    Dispense:  20 tablet    Refill:  0   acetaminophen  (TYLENOL ) 500 MG tablet    Sig: Take 2 tablets (1,000 mg total) by mouth every 6 (six) hours as needed for moderate pain (pain score 4-6) or mild pain (pain score 1-3).    ASSESSMENT   1. Inevitable spontaneous abortion     PLAN  Discharge home in stable condition with return precautions.  Patient opted for expectant for fetal  demise/SAB. Discussed expectations for symptoms and when to return. All questions answered. Recommend alternating ibuprofen  and Tylenol  for baseline pain control.  Zofran  to use prn for nausea. Message sent to [facility] to schedule follow up in 2 weeks.  Allergies as of 07/23/2024   No Known Allergies      Medication List     TAKE these medications    acetaminophen  500 MG tablet Commonly known as: TYLENOL  Take 2 tablets (1,000 mg total) by mouth every 6 (six) hours as needed for moderate pain (pain score 4-6) or mild pain (pain score 1-3).   ibuprofen  800 MG tablet Commonly known as: ADVIL  Take 1 tablet (800 mg total) by mouth 3 (three) times daily with meals as needed for headache or moderate pain (pain score 4-6).   ondansetron  4 MG disintegrating tablet Commonly known as: ZOFRAN -ODT Take 1 tablet (4 mg total) by mouth every 6 (six) hours as needed for nausea.        Joesph DELENA Sear, PA      [1]  Social History Tobacco Use   Smoking status: Never    Passive exposure: Yes   Smokeless tobacco: Never  Vaping Use   Vaping status: Never Used  Substance Use Topics   Alcohol use: No   Drug use: Yes    Types: Marijuana  [2] No Known Allergies

## 2024-07-24 LAB — CULTURE, OB URINE: Culture: NO GROWTH

## 2024-07-24 LAB — GC/CHLAMYDIA PROBE AMP (~~LOC~~) NOT AT ARMC
Chlamydia: NEGATIVE
Comment: NEGATIVE
Comment: NORMAL
Neisseria Gonorrhea: NEGATIVE

## 2024-08-06 ENCOUNTER — Ambulatory Visit: Payer: Self-pay | Admitting: Family Medicine
